# Patient Record
Sex: Male | Born: 1990 | Race: White | Hispanic: No | Marital: Married | Smoking: Never smoker
Health system: Southern US, Community
[De-identification: ages and names within clinical notes are randomized; demographics above are authoritative.]

## PROBLEM LIST (undated history)

## (undated) HISTORY — PX: WISDOM TOOTH EXTRACTION: SHX21

---
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
## 2012-03-27 ENCOUNTER — Encounter: Payer: Self-pay | Admitting: Family Medicine

## 2012-03-27 ENCOUNTER — Ambulatory Visit (INDEPENDENT_AMBULATORY_CARE_PROVIDER_SITE_OTHER): Payer: Managed Care, Other (non HMO) | Admitting: Family Medicine

## 2012-03-27 VITALS — BP 110/72 | Temp 98.0°F | Wt 172.0 lb

## 2012-03-27 DIAGNOSIS — J45909 Unspecified asthma, uncomplicated: Secondary | ICD-10-CM

## 2012-03-27 DIAGNOSIS — J4540 Moderate persistent asthma, uncomplicated: Secondary | ICD-10-CM

## 2012-03-27 MED ORDER — ALBUTEROL SULFATE HFA 108 (90 BASE) MCG/ACT IN AERS: 2.0000 | INHALATION_SPRAY | RESPIRATORY_TRACT | Status: DC | PRN

## 2012-03-27 MED ORDER — PREDNISONE 10 MG PO TABS: ORAL_TABLET | ORAL | Status: DC

## 2012-03-27 MED ORDER — BECLOMETHASONE DIPROPIONATE 80 MCG/ACT IN AERS: 2.0000 | INHALATION_SPRAY | Freq: Two times a day (BID) | RESPIRATORY_TRACT | Status: DC

## 2012-03-27 NOTE — Progress Notes (Signed)
  Subjective:    Patient ID: Lee Martin, male    DOB: 05-05-1991, 21 y.o.   MRN: [id number]  HPI  Patient has asthma. Possibly worsened over the spring with allergies. He is now having daily wheezing and frequent use of Proventil. His inhaler is over 36-year-old. He is having nighttime coughing and is starting to impair activities with dyspnea with exertion. He is not any recent infectious symptoms such as fever or chills.  He does not recall being on anti-inflammatory medications in the past. He has a history of frequent wheezing in the spring time. Nonsmoker.  Past Medical History  Diagnosis Date  . Asthma    Past Surgical History  Procedure Date  . Wisdom tooth extraction     reports that he has never smoked. He does not have any smokeless tobacco history on file. His alcohol and drug histories not on file. family history includes Diabetes in his father and maternal grandfather; Hyperlipidemia in his father; and Hypertension in his father. No Known Allergies    Review of Systems  Constitutional: Negative for fever and chills.  HENT: Positive for congestion. Negative for sore throat.   Respiratory: Positive for cough, shortness of breath and wheezing.   Cardiovascular: Negative for chest pain.  Skin: Negative for rash.  Neurological: Negative for dizziness and headaches.  Hematological: Negative for adenopathy.       Objective:   Physical Exam  Constitutional: He appears well-developed and well-nourished.  HENT:  Right Ear: External ear normal.  Left Ear: External ear normal.  Mouth/Throat: Oropharynx is clear and moist.  Neck: Neck supple. No thyromegaly present.  Cardiovascular: Normal rate and regular rhythm.   Pulmonary/Chest:       Patient has diffuse expiratory wheezes. No rales.  Lymphadenopathy:    He has no cervical adenopathy.          Assessment & Plan:  Asthma with at least moderate persistent severity-worsening symptoms over several weeks.. Oral  prednisone over the next 8 days. Refill Proventil. Start Qvar 80 2 puffs twice daily. Touch base in 2 weeks if symptoms not greatly improved

## 2012-03-27 NOTE — Patient Instructions (Signed)
Asthma, Adult Asthma is caused by narrowing of the air passages in the lungs. It may be triggered by pollen, dust, animal dander, molds, some foods, respiratory infections, exposure to smoke, exercise, emotional stress or other allergens (things that cause allergic reactions or allergies). Repeat attacks are common. HOME CARE INSTRUCTIONS   Use prescription medications as ordered by your caregiver.   Avoid pollen, dust, animal dander, molds, smoke and other things that cause attacks at home and at work.   You may have fewer attacks if you decrease dust in your home. Electrostatic air cleaners may help.   It may help to replace your pillows or mattress with materials less likely to cause allergies.   Talk to your caregiver about an action plan for managing asthma attacks at home, including, the use of a peak flow meter which measures the severity of your asthma attack. An action plan can help minimize or stop the attack without having to seek medical care.   If you are not on a fluid restriction, drink 8 to 10 glasses of water each day.   Always have a plan prepared for seeking medical attention, including, calling your physician, accessing local emergency care, and calling 911 (in the [place] for a severe attack.   Discuss possible exercise routines with your caregiver.   If animal dander is the cause of asthma, you may need to get rid of pets.  SEEK MEDICAL CARE IF:   You have wheezing and shortness of breath even if taking medicine to prevent attacks.   You have muscle aches, chest pain or thickening of sputum.   Your sputum changes from clear or white to yellow, green, gray, or bloody.   You have any problems that may be related to the medicine you are taking (such as a rash, itching, swelling or trouble breathing).  SEEK IMMEDIATE MEDICAL CARE IF:   Your usual medicines do not stop your wheezing or there is increased coughing and/or shortness of breath.   You have increased  difficulty breathing.   You have a fever.  MAKE SURE YOU:   Understand these instructions.   Will watch your condition.   Will get help right away if you are not doing well or get worse.  Document Released: 11/01/2005 Document Revised: 10/21/2011 Document Reviewed: 06/19/2008 [facility] Patient Information 2012 [place], [place].  Qvar 80 mcg 2 puffs every 12 hours and rinse mouth after use

## 2013-04-11 ENCOUNTER — Telehealth: Payer: Self-pay | Admitting: Family Medicine

## 2013-04-11 NOTE — Telephone Encounter (Signed)
PT called to request a refill of his albuterol (PROVENTIL HFA;VENTOLIN HFA) 108 (90 BASE) MCG/ACT inhaler. I explained to him that it has been over a year since we've seen him in the office, but he insisted that this was only for asthma and he should be able to receive a refill. Please assist.

## 2013-04-11 NOTE — Telephone Encounter (Signed)
I did inform pt of need for OV annually to fill prescription medicines.  He did say he still had some left, and has plenty of the QVar left.  Pt will call for OV soon.

## 2013-05-01 ENCOUNTER — Encounter: Payer: Self-pay | Admitting: Family Medicine

## 2013-05-01 ENCOUNTER — Ambulatory Visit (INDEPENDENT_AMBULATORY_CARE_PROVIDER_SITE_OTHER): Payer: Managed Care, Other (non HMO) | Admitting: Family Medicine

## 2013-05-01 VITALS — BP 100/70 | Temp 97.5°F | Wt 175.0 lb

## 2013-05-01 DIAGNOSIS — J45909 Unspecified asthma, uncomplicated: Secondary | ICD-10-CM

## 2013-05-01 DIAGNOSIS — J4520 Mild intermittent asthma, uncomplicated: Secondary | ICD-10-CM | POA: Insufficient documentation

## 2013-05-01 MED ORDER — ALBUTEROL SULFATE HFA 108 (90 BASE) MCG/ACT IN AERS: 2.0000 | INHALATION_SPRAY | RESPIRATORY_TRACT | Status: DC | PRN

## 2013-05-01 NOTE — Patient Instructions (Addendum)
Asthma, Adult Asthma is a condition that affects your lungs. It is characterized by swelling and narrowing of your airways as well as increased mucus production. The narrowing comes from swelling and muscle spasms inside the airways. When this happens, breathing can be difficult and you can have coughing, wheezing, and shortness of breath. Knowing more about asthma can help you manage it better. Asthma cannot be cured, but medicines and lifestyle changes can help control it. Asthma can be a minor problem for some people but if it is not controlled it can lead to a life-threatening asthma attack. Asthma can change over time. It is important to work with your caregiver to manage your asthma symptoms. CAUSES The exact cause of asthma is unknown. Asthma is believed to be caused by inherited (genetic) and environmental exposures. Swelling and redness (inflammation) of the airways occurs in asthma. This can be triggered by allergies, viral lung infections, or irritants in the air. Allergic reactions can cause you to wheeze immediately or several hours after an exposure. Asthma triggers are different for each person. It is important to pay attention and know what triggers your asthma.  Common triggers for asthma attacks include:  Animal dander from the skin, hair, or feathers of animals.  Dust mites contained in house dust.  Cockroaches.  Pollen from trees or grass.  Mold.  Cigarette or tobacco smoke. Smoking cannot be allowed in homes of people with asthma. People with asthma should not smoke and should not be around smokers.  Air pollutants such as dust, household cleaners, hair sprays, aerosol sprays, paint fumes, strong chemicals, or strong odors.  Cold air or weather changes. Cold air may cause inflammation. Winds increase molds and pollens in the air. There is not one best climate for people with asthma.  Strong emotions such as crying or laughing hard.  Stress.  Certain medicines such as  aspirin or beta-blockers.  Sulfites in such foods and drinks as dried fruits and wine.  Infections or inflammatory conditions such as the flu, a cold, or an inflammation of the nasal membranes (rhinitis).  Gastroesophageal reflux disease (GERD). GERD is a condition where stomach acid backs up into your throat (esophagus).  Exercise or strenous activity. Proper pre-exercise medicines allow most people to participate in sports. SYMPTOMS  Feeling short of breath.  Chest tightness or pain.  Difficulty sleeping due to coughing, wheezing, or feeling short of breath.  A whistling or wheezing sound with exhalation.  Coughing or wheezing that is worse when you:  Have a virus (such as a cold or the flu).  Are suffering from allergies.  Are exposed to certain fumes or chemicals.  Exercise. Signs that your asthma is probably getting worse include:   More frequent and bothersome asthma signs and symptoms.  Increasing difficulty breathing. This can be measured by a peak flow meter, which is a simple device used to check how well your lungs are working.  An increasingly frequent need to use a quick-relief inhaler. DIAGNOSIS  The diagnosis of asthma is made by review of your medical history, a physical exam, and possibly from other tests. Lung function studies may help with the diagnosis. TREATMENT  Asthma cannot be cured. However, for the majority of adults, asthma can be controlled with treatment. Besides avoidance of triggers of your asthma, medicines are often required. There are 2 classes of medicine used for asthma treatment: controller medicines (reduce inflammation and symptoms) andreliever or rescue medicines (relieve asthma symptoms during acute attacks). You may require daily   medicines to control your asthma. The most effective long-term controller medicines for asthma are inhaled corticosteroids (blocks inflammation). Other long-term control medicines include:  Leukotriene  receptor antagonists (blocks a pathway of inflammation).  Long-acting beta2-agonists (relaxes the muscles of the airways for at least 12 hours) with an inhaled corticosteroid.  Cromolyn sodium or nedocromil (alters certain inflammatory cells' ability to release chemicals that cause inflammation).  Immunomodulators (alters the immune system to prevent asthma symptoms).  Theophylline (relaxes muscles in the airways). You may also require a short-acting beta2-agonist to relieve asthma symptoms during an acute attack. You should understand what to do during an acute attack. Inhaled medicines are effective when used properly. Read the instructions on how to use your medicines correctly and speak to your caregiver if you have questions. Follow up with your caregiver on a regular basis to make sure your asthma is well-controlled. If your asthma is not well-controlled, if you have been hospitalized for asthma, or if multiple medicines or medium to high doses of inhaled corticosteroids are needed to control your asthma, request a referral to an asthma specialist. HOME CARE INSTRUCTIONS   Take medicines as directed by your caregiver.  Control your home environment in the following ways to help prevent asthma attacks:  Change your heating and air conditioning filter at least once a month.  Place a filter or cheesecloth over your heating and air conditioning vents.  Limit the use of fireplaces and wood stoves.  Do not smoke. Do not stay in places where others are smoking.  Get rid of pests (such as roaches and mice) and their droppings.  If you see mold on a plant, throw it away.  Clean your floors and dust every week. Use unscented cleaning products. Use a vacuum cleaner with a HEPA filter if possible. If vacuuming or cleaning triggers your asthma, try to find someone else to do these chores.  Floors in your house should be wood, tile, or vinyl. Carpet can trap dander and dust.  Use  allergy-proof pillows, mattress covers, and box spring covers.  Wash bedsheets and blankets every week in hot water and dry in a dryer.  Use a blanket that is made of polyester or cotton with a tight nap.  Do not use a dust ruffle on your bed.  Clean bathrooms and kitchens with bleach and repaint with mold-resistant paint.  Wash hands frequently.  Talk to your caregiver about an action plan for managing asthma attacks. This includes the use of a peak flow meter which measures the severity of the attack and medicines that can help stop the attack. An action plan can help minimize or stop the attack without having to seek medical care.  Remain calm during an asthma attack.  Always have a plan prepared for seeking medical attention. This should include contacting your caregiver and in the case of a severe attack, calling your local emergency services (911 in [place] SEEK MEDICAL CARE IF:   You have wheezing, shortness of breath, or a cough even if taking medicine to prevent attacks.  You have thickening of sputum.  Your sputum changes from clear or white to yellow, green, gray, or bloody.  You have any problems that may be related to the medicines you are taking (such as a rash, itching, swelling, or trouble breathing).  You are using a reliever medicine more than 2 3 times per week.  Your peak flow is still at 50 79% of personal best after following your action plan for 1   hour. SEEK IMMEDIATE MEDICAL CARE IF:   You are short of breath even at rest.  You get short of breath when doing very little physical activity.  You have difficulty eating, drinking, or talking due to asthma symptoms.  You have chest pain or you feel that your heart is beating fast.  You have a bluish color to your lips or fingernails.  You are lightheaded, dizzy, or faint.  You have a fever or persistent symptoms for more than 2 3 days.  You have a fever and symptoms suddenly get worse.  You seem to be  getting worse and are unresponsive to treatment during an asthma attack.  Your peak flow is less than 50% of personal best. MAKE SURE YOU:   Understand these instructions.  Will watch your condition.  Will get help right away if you are not doing well or get worse. Document Released: 11/01/2005 Document Revised: 10/18/2012 Document Reviewed: 06/19/2008 [employer] Patient Information 2014 [employer].  

## 2013-05-01 NOTE — Progress Notes (Signed)
  Subjective:    Patient ID: Lee Martin, male    DOB: 05-26-1991, 22 y.o.   MRN: [id number]  HPI Patient here for followup asthma. This is probably more mild intermittent asthma. He states he has used less than 1 albuterol inhaler for the entire year. 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 Asthma tends to be more seasonal. He uses Qvar intermittently as needed during season. Had some wheezing last week but none at this time. Requesting refills.  Nonsmoker. Otherwise very healthy.  Past Medical History  Diagnosis Date  . Asthma    Past Surgical History  Procedure Laterality Date  . Wisdom tooth extraction      reports that he has never smoked. He does not have any smokeless tobacco history on file. His alcohol and drug histories are not on file. family history includes Diabetes in his father and maternal grandfather; Hyperlipidemia in his father; and Hypertension in his father. No Known Allergies    Review of Systems  Constitutional: Negative for fever and chills.  HENT: Negative for congestion.   Respiratory: Negative for cough and shortness of breath.        Objective:   Physical Exam  Constitutional: He appears well-developed and well-nourished.  HENT:  Right Ear: External ear normal.  Left Ear: External ear normal.  Mouth/Throat: Oropharynx is clear and moist.  Neck: Neck supple. No thyromegaly present.  Cardiovascular: Normal rate and regular rhythm.   No murmur heard. Pulmonary/Chest: Effort normal and breath sounds normal. No respiratory distress. He has no wheezes. He has no rales.  Lymphadenopathy:    He has no cervical adenopathy.          Assessment & Plan:  Asthma. This appears to be probably mild intermittent. Refill ProAir inhaler for as needed use. He knows to start back Qvar regularly for more persistent symptoms. Continue yearly flu vaccine.

## 2015-01-20 ENCOUNTER — Other Ambulatory Visit: Payer: Self-pay | Admitting: Family Medicine

## 2015-01-24 ENCOUNTER — Telehealth: Payer: Self-pay | Admitting: Family Medicine

## 2015-01-24 MED ORDER — ALBUTEROL SULFATE HFA 108 (90 BASE) MCG/ACT IN AERS: 2.0000 | INHALATION_SPRAY | RESPIRATORY_TRACT | Status: DC | PRN

## 2015-01-24 NOTE — Telephone Encounter (Signed)
Rx sent to pharmacy and pt is aware. 

## 2015-01-24 NOTE — Telephone Encounter (Signed)
Needs Proair refilled but cannot sch a visit until school is out in June. Will have to check his work schedule as well. Needs his inhaler refilled through June. Please call pt and leave a message once refilled.

## 2016-03-16 ENCOUNTER — Ambulatory Visit (INDEPENDENT_AMBULATORY_CARE_PROVIDER_SITE_OTHER): Payer: Managed Care, Other (non HMO) | Admitting: Family Medicine

## 2016-03-16 VITALS — BP 120/88 | HR 75 | Temp 98.3°F | Ht 68.5 in | Wt 199.3 lb

## 2016-03-16 DIAGNOSIS — J4520 Mild intermittent asthma, uncomplicated: Secondary | ICD-10-CM

## 2016-03-16 MED ORDER — BECLOMETHASONE DIPROPIONATE 80 MCG/ACT IN AERS: 2.0000 | INHALATION_SPRAY | Freq: Two times a day (BID) | RESPIRATORY_TRACT | Status: DC

## 2016-03-16 MED ORDER — ALBUTEROL SULFATE HFA 108 (90 BASE) MCG/ACT IN AERS: 2.0000 | INHALATION_SPRAY | RESPIRATORY_TRACT | Status: DC | PRN

## 2016-03-16 NOTE — Progress Notes (Signed)
   Subjective:    Patient ID: Lee Martin, male    DOB: 05/04/1991, 25 y.o.   MRN: [id number]  HPI Patient seen for follow-up asthma. Predominantly mild intermittent but she's had more wheezing recently during the spring with allergies he takes pro-air as needed. Has used Qvar in the past but not for several months now. Frequently has been using his albuterol inhaler at least a few times per week. No recent fevers or chills. Nonsmoker. Exercises regularly. Takes no other medications.  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  Past Medical History  Diagnosis Date  . Asthma    Past Surgical History  Procedure Laterality Date  . Wisdom tooth extraction      reports that he has never smoked. He does not have any smokeless tobacco history on file. His alcohol and drug histories are not on file. family history includes Diabetes in his father and maternal grandfather; Hyperlipidemia in his father; Hypertension in his father. No Known Allergies    Review of Systems  Constitutional: Negative for fever and chills.  Respiratory: Negative for cough and shortness of breath.        Objective:   Physical Exam  Constitutional: He appears well-developed and well-nourished.  HENT:  Right Ear: External ear normal.  Left Ear: External ear normal.  Mouth/Throat: Oropharynx is clear and moist.  Neck: Neck supple.  Cardiovascular: Normal rate and regular rhythm.   Pulmonary/Chest: Effort normal and breath sounds normal. No respiratory distress. He has no wheezes. He has no rales.  Lymphadenopathy:    He has no cervical adenopathy.          Assessment & Plan:  Asthma. This has been labeled as mild intermittent (and probably has been in the past) but recently has had more frequent wheezing. We recommend starting back Qvar 2 puffs twice daily and continue as needed albuterol with refills given.  He appears to have  significant seasonal variation related to allergies.  Kristian CoveyBruce W Edilberto Roosevelt MD [facility]

## 2016-03-16 NOTE — Progress Notes (Signed)
Pre visit review using our clinic review tool, if applicable. No additional management support is needed unless otherwise documented below in the visit note. 

## 2017-04-05 ENCOUNTER — Encounter: Payer: Self-pay | Admitting: Family Medicine

## 2017-04-05 ENCOUNTER — Ambulatory Visit (INDEPENDENT_AMBULATORY_CARE_PROVIDER_SITE_OTHER): Payer: Managed Care, Other (non HMO) | Admitting: Family Medicine

## 2017-04-05 VITALS — BP 104/70 | HR 56 | Temp 98.3°F | Ht 69.25 in | Wt 200.5 lb

## 2017-04-05 DIAGNOSIS — Z0000 Encounter for general adult medical examination without abnormal findings: Secondary | ICD-10-CM

## 2017-04-05 MED ORDER — ALBUTEROL SULFATE HFA 108 (90 BASE) MCG/ACT IN AERS: 2.0000 | INHALATION_SPRAY | RESPIRATORY_TRACT | 3 refills | Status: DC | PRN

## 2017-04-05 NOTE — Progress Notes (Signed)
Subjective:     Patient ID: Lee Martin, male   DOB: 09/13/1991, 26 y.o.   MRN: [id number]  HPI   Patient for physical exam. He graduated from college last year and works currently as an Art gallery managerengineer. Staying very busy recently. No very consistent exercise recently. He is discouraged in that he has had some recent difficulty losing weight. Generally seems to have very healthy diet. Not tracking calories.  He states he had blood work through his employer and no significant abnormalities. He has intermittent asthma and requesting refill of albuterol. He's had some problems with allergies this spring. His tetanus is up-to-date.  Past Medical History:  Diagnosis Date  . Asthma    Past Surgical History:  Procedure Laterality Date  . WISDOM TOOTH EXTRACTION      reports that he has never smoked. He has never used smokeless tobacco. His alcohol and drug histories are not on file. family history includes Diabetes in his father and maternal grandfather; Hyperlipidemia in his father; Hypertension in his father. No Known Allergies   Review of Systems  Constitutional: Negative for activity change, appetite change, fatigue and fever.  HENT: Negative for congestion, ear pain and trouble swallowing.   Eyes: Negative for pain and visual disturbance.  Respiratory: Negative for cough, shortness of breath and wheezing.   Cardiovascular: Negative for chest pain and palpitations.  Gastrointestinal: Negative for abdominal distention, abdominal pain, blood in stool, constipation, diarrhea, nausea, rectal pain and vomiting.  Genitourinary: Negative for dysuria, hematuria and testicular pain.  Musculoskeletal: Negative for arthralgias and joint swelling.  Skin: Negative for rash.  Neurological: Negative for dizziness, syncope and headaches.  Hematological: Negative for adenopathy.  Psychiatric/Behavioral: Negative for confusion and dysphoric mood.       Objective:   Physical Exam  Constitutional: He is  oriented to person, place, and time. He appears well-developed and well-nourished. No distress.  HENT:  Head: Normocephalic and atraumatic.  Right Ear: External ear normal.  Left Ear: External ear normal.  Mouth/Throat: Oropharynx is clear and moist.  Eyes: Conjunctivae and EOM are normal. Pupils are equal, round, and reactive to light.  Neck: Normal range of motion. Neck supple. No thyromegaly present.  Cardiovascular: Normal rate, regular rhythm and normal heart sounds.   No murmur heard. Pulmonary/Chest: No respiratory distress. He has no wheezes. He has no rales.  Abdominal: Soft. Bowel sounds are normal. He exhibits no distension and no mass. There is no tenderness. There is no rebound and no guarding.  Musculoskeletal: He exhibits no edema.  Lymphadenopathy:    He has no cervical adenopathy.  Neurological: He is alert and oriented to person, place, and time. He displays normal reflexes. No cranial nerve deficit.  Skin: No rash noted.  Psychiatric: He has a normal mood and affect.       Assessment:     Physical exam. Generally healthy 26 year old male    Plan:     -We discussed healthy strategies for weight loss. He will consider calorie tracking application and reduce sugars and starches -Increased frequency of exercise and do combination of aerobic and resistance training -Asked that he try to forward copy of recent labs through his work to us  Kristian CoveyBruce W Maricsa Sammons MD [facility]

## 2017-12-02 ENCOUNTER — Ambulatory Visit: Payer: BLUE CROSS/BLUE SHIELD | Admitting: Family Medicine

## 2017-12-02 ENCOUNTER — Encounter: Payer: Self-pay | Admitting: Family Medicine

## 2017-12-02 VITALS — BP 102/64 | HR 86 | Temp 98.7°F | Wt 205.7 lb

## 2017-12-02 DIAGNOSIS — M5416 Radiculopathy, lumbar region: Secondary | ICD-10-CM

## 2017-12-02 MED ORDER — PREDNISONE 10 MG PO TABS: ORAL_TABLET | ORAL | 0 refills | Status: DC

## 2017-12-02 NOTE — Progress Notes (Signed)
Subjective:     Patient ID: Lee Martin, male   DOB: 09/14/1991, 27 y.o.   MRN: [id number]  HPI Patient seen with some progressive pain is rated from his right lumbar area down to the buttock and always to the foot. He states that symptoms started about 3 weeks ago. On New Year's Eve he ran about 5 miles and then went to a concert at night and states he was dancing for about 6 hours. There is no injury. No alcohol use. By January 3 he noted some sharp pains rating from the lower back down to the right foot that were sharp and very intense at times. He also has some numbness in the calf region and also lateral foot. Took some Aleve without much relief.  He went to urgent care around January 5 was placed on prednisone which initially seemed to help slightly. He has some persistent pain even with the prednisone. He requested pain medications was but was declined. Since that time he's had some progressive symptoms and has numbness in the calf and also bottom of the foot and lateral foot region. He is noticing some increased limp and weakness in the foot. No urine or stool incontinence. No prior history of back difficulties. Pain is really interfering with sleep significantly this time.  Past Medical History:  Diagnosis Date  . Asthma    Past Surgical History:  Procedure Laterality Date  . WISDOM TOOTH EXTRACTION      reports that  has never smoked. he has never used smokeless tobacco. His alcohol and drug histories are not on file. family history includes Diabetes in his father and maternal grandfather; Hyperlipidemia in his father; Hypertension in his father. No Known Allergies   Review of Systems  Constitutional: Negative for appetite change, fever and unexpected weight change.  Respiratory: Negative for shortness of breath.   Cardiovascular: Negative for chest pain.  Gastrointestinal: Negative for abdominal pain.  Genitourinary: Negative for dysuria.  Musculoskeletal: Positive for back  pain.  Skin: Negative for rash.  Neurological: Positive for weakness. Negative for dizziness.  Hematological: Negative for adenopathy.       Objective:   Physical Exam  Constitutional: He appears well-developed and well-nourished.  Cardiovascular: Normal rate and regular rhythm.  Pulmonary/Chest: Effort normal and breath sounds normal. No respiratory distress. He has no wheezes. He has no rales.  Musculoskeletal:  Straight leg raise are positive on the right. No leg edema. No localized tenderness.  Neurological:  Patient has weakness with dorsiflexion on the right compared to the left. He has fairly preserved strength with plantar flexion bilaterally. He has 2+ reflexes knee and 2+ left ankle. He has absence of reflux right ankle. He has impaired sentry to touch involving the posterior calf and right lateral foot region       Assessment:     Progressive right-sided sciatica symptoms. He has neurologic findings including numbness, weakness, and absence of right ankle reflex.    Plan:     -Prescribed 1 more go round of prednisone -Set up MRI lumbar spine. We explained to patient we do not normally get MRI this soon of onset of symptoms but he has definite weakness with dorsiflexion on the right with absence of ankle reflex and progressive numbness and pain -Touch base for any progressive pain or any new symptoms  Kristian CoveyBruce W Larnce Schnackenberg MD [facility]

## 2017-12-02 NOTE — Patient Instructions (Signed)
Sciatica Sciatica is pain, numbness, weakness, or tingling along the path of the sciatic nerve. The sciatic nerve starts in the lower back and runs down the back of each leg. The nerve controls the muscles in the lower leg and in the back of the knee. It also provides feeling (sensation) to the back of the thigh, the lower leg, and the sole of the foot. Sciatica is a symptom of another medical condition that pinches or puts pressure on the sciatic nerve. Generally, sciatica only affects one side of the body. Sciatica usually goes away on its own or with treatment. In some cases, sciatica may keep coming back (recur). What are the causes? This condition is caused by pressure on the sciatic nerve, or pinching of the sciatic nerve. This may be the result of:  A disk in between the bones of the spine (vertebrae) bulging out too far (herniated disk).  Age-related changes in the spinal disks (degenerative disk disease).  A pain disorder that affects a muscle in the buttock (piriformis syndrome).  Extra bone growth (bone spur) near the sciatic nerve.  An injury or break (fracture) of the pelvis.  Pregnancy.  Tumor (rare).  What increases the risk? The following factors may make you more likely to develop this condition:  Playing sports that place pressure or stress on the spine, such as football or weight lifting.  Having poor strength and flexibility.  A history of back injury.  A history of back surgery.  Sitting for long periods of time.  Doing activities that involve repetitive bending or lifting.  Obesity.  What are the signs or symptoms? Symptoms can vary from mild to very severe, and they may include:  Any of these problems in the lower back, leg, hip, or buttock: ? Mild tingling or dull aches. ? Burning sensations. ? Sharp pains.  Numbness in the back of the calf or the sole of the foot.  Leg weakness.  Severe back pain that makes movement difficult.  These  symptoms may get worse when you cough, sneeze, or laugh, or when you sit or stand for long periods of time. Being overweight may also make symptoms worse. In some cases, symptoms may recur over time. How is this diagnosed? This condition may be diagnosed based on:  Your symptoms.  A physical exam. Your health care provider may ask you to do certain movements to check whether those movements trigger your symptoms.  You may have tests, including: ? Blood tests. ? X-rays. ? MRI. ? CT scan.  How is this treated? In many cases, this condition improves on its own, without any treatment. However, treatment may include:  Reducing or modifying physical activity during periods of pain.  Exercising and stretching to strengthen your abdomen and improve the flexibility of your spine.  Icing and applying heat to the affected area.  Medicines that help: ? To relieve pain and swelling. ? To relax your muscles.  Injections of medicines that help to relieve pain, irritation, and inflammation around the sciatic nerve (steroids).  Surgery.  Follow these instructions at home: Medicines  Take over-the-counter and prescription medicines only as told by your health care provider.  Do not drive or operate heavy machinery while taking prescription pain medicine. Managing pain  If directed, apply ice to the affected area. ? Put ice in a plastic bag. ? Place a towel between your skin and the bag. ? Leave the ice on for 20 minutes, 2-3 times a day.  After icing, apply   heat to the affected area before you exercise or as often as told by your health care provider. Use the heat source that your health care provider recommends, such as a moist heat pack or a heating pad. ? Place a towel between your skin and the heat source. ? Leave the heat on for 20-30 minutes. ? Remove the heat if your skin turns bright red. This is especially important if you are unable to feel pain, heat, or cold. You may have a  greater risk of getting burned. Activity  Return to your normal activities as told by your health care provider. Ask your health care provider what activities are safe for you. ? Avoid activities that make your symptoms worse.  Take brief periods of rest throughout the day. Resting in a lying or standing position is usually better than sitting to rest. ? When you rest for longer periods, mix in some mild activity or stretching between periods of rest. This will help to prevent stiffness and pain. ? Avoid sitting for long periods of time without moving. Get up and move around at least one time each hour.  Exercise and stretch regularly, as told by your health care provider.  Do not lift anything that is heavier than 10 lb (4.5 kg) while you have symptoms of sciatica. When you do not have symptoms, you should still avoid heavy lifting, especially repetitive heavy lifting.  When you lift objects, always use proper lifting technique, which includes: ? Bending your knees. ? Keeping the load close to your body. ? Avoiding twisting. General instructions  Use good posture. ? Avoid leaning forward while sitting. ? Avoid hunching over while standing.  Maintain a healthy weight. Excess weight puts extra stress on your back and makes it difficult to maintain good posture.  Wear supportive, comfortable shoes. Avoid wearing high heels.  Avoid sleeping on a mattress that is too soft or too hard. A mattress that is firm enough to support your back when you sleep may help to reduce your pain.  Keep all follow-up visits as told by your health care provider. This is important. Contact a health care provider if:  You have pain that wakes you up when you are sleeping.  You have pain that gets worse when you lie down.  Your pain is worse than you have experienced in the past.  Your pain lasts longer than 4 weeks.  You experience unexplained weight loss. Get help right away if:  You lose control  of your bowel or bladder (incontinence).  You have: ? Weakness in your lower back, pelvis, buttocks, or legs that gets worse. ? Redness or swelling of your back. ? A burning sensation when you urinate. This information is not intended to replace advice given to you by your health care provider. Make sure you discuss any questions you have with your health care provider. Document Released: 10/26/2001 Document Revised: 04/06/2016 Document Reviewed: 07/11/2015 Elsevier Interactive Patient Education  2018 [employer]  

## 2017-12-11 ENCOUNTER — Ambulatory Visit
Admission: RE | Admit: 2017-12-11 | Discharge: 2017-12-11 | Disposition: A | Discharge status: Home / Self Care | Payer: BLUE CROSS/BLUE SHIELD | Source: Ambulatory Visit | Attending: Family Medicine | Admitting: Family Medicine

## 2017-12-11 DIAGNOSIS — M5416 Radiculopathy, lumbar region: Secondary | ICD-10-CM

## 2017-12-12 ENCOUNTER — Other Ambulatory Visit: Payer: Self-pay | Admitting: Family Medicine

## 2017-12-12 DIAGNOSIS — M545 Low back pain, unspecified: Secondary | ICD-10-CM

## 2017-12-13 ENCOUNTER — Telehealth: Payer: Self-pay | Admitting: Family Medicine

## 2017-12-13 DIAGNOSIS — M545 Low back pain: Secondary | ICD-10-CM

## 2017-12-13 NOTE — Telephone Encounter (Signed)
Spoke with pt.  My only concern was that he had diminished ankle reflex and weakness with dorsiflexion.  His symptoms are stable.  Still has some numbness but pain some improved and he thinks strength some improved.  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    Let's see if we can get in to see Lee Martin as soon as possible.  May still need to see neurosurgical specialist if symptoms persist.

## 2017-12-13 NOTE — Telephone Encounter (Signed)
Lee OsierMichael Martin called and would like to see another provider before seeing Dr. Estill BambergMark Martin. He wants to [facility] Antoine PrimasZachary Smith, [facility] before he speaks to a surgeon.

## 2017-12-14 ENCOUNTER — Telehealth: Payer: Self-pay | Admitting: Family Medicine

## 2017-12-14 NOTE — Addendum Note (Signed)
Addended by: Kern ReapVEREEN, Jeriel Vivanco B on: 12/14/2017 09:21 AM   Modules accepted: Orders

## 2017-12-14 NOTE — Telephone Encounter (Signed)
Patient is requesting second opinion on back surgery before being referred to a surgeon by Dr. Caryl NeverBurchette.  Can patient be worked in before next available new slot?

## 2017-12-14 NOTE — Telephone Encounter (Signed)
Copied from CRM [phone]. Topic: Appointment Scheduling - Scheduling Inquiry for Clinic >> Dec 14, 2017 11:38 AM Stephannie LiSimmons, Criston Chancellor L, NT wrote: Reason for CRM: [facility]  like this patient scheduled as soon as possible by Dr Katrinka BlazingSmith ,please advise

## 2017-12-14 NOTE — Telephone Encounter (Signed)
Referral placed.

## 2017-12-14 NOTE — Telephone Encounter (Signed)
Copied from CRM [phone]. Topic: General - Other >> Dec 14, 2017 11:42 AM Stephannie LiSimmons, Zakery Normington L, NT wrote: Reason for CRM: Dr Katrinka BlazingSmith does not have any availability until 12/26/17  at  2:15 pm [facility] .

## 2017-12-15 NOTE — Telephone Encounter (Signed)
Pt scheduled for 2.4.19 with dr Katrinka Blazingsmith.

## 2017-12-16 ENCOUNTER — Encounter: Payer: Self-pay | Admitting: Family Medicine

## 2017-12-16 ENCOUNTER — Ambulatory Visit: Payer: BLUE CROSS/BLUE SHIELD | Admitting: Family Medicine

## 2017-12-16 VITALS — BP 112/80 | HR 74 | Ht 70.0 in | Wt 204.0 lb

## 2017-12-16 DIAGNOSIS — M5416 Radiculopathy, lumbar region: Secondary | ICD-10-CM | POA: Diagnosis not present

## 2017-12-16 DIAGNOSIS — M549 Dorsalgia, unspecified: Secondary | ICD-10-CM | POA: Diagnosis not present

## 2017-12-16 MED ORDER — MELOXICAM 15 MG PO TABS: 15.0000 mg | ORAL_TABLET | Freq: Every day | ORAL | 0 refills | Status: DC

## 2017-12-16 MED ORDER — GABAPENTIN 100 MG PO CAPS: 200.0000 mg | ORAL_CAPSULE | Freq: Every day | ORAL | 3 refills | Status: DC

## 2017-12-16 NOTE — Assessment & Plan Note (Signed)
Patient continues to have a positive straight leg test.  With only some mild weakness of dorsiflexion.  States that this seems to be better than what it was last week.  Reading patient's notes it does seem that patient is doing relatively better.  Discussing with him at patient's age and had been responding a little bit to the anti-inflammatories I would like to continue with conservative therapy.  Started on gabapentin for nighttime relief as well as meloxicam.  Sent to formal physical therapy and given home exercises today.  Discussed icing progression patient will hold on any type of significant physical activity until strength is improved.  We discussed multiple different other treatment options in the long run including osteopathic manipulation as well as the possibility of epidural or nerve root injections.  Patient is in agreement with plan and will follow up with me again in 2 weeks

## 2017-12-16 NOTE — Patient Instructions (Addendum)
Good to see you  Ice is your friend Ice 20 minutes 2 times daily. Usually after activity and before bed. Exercises 3 times a week.  Gabapentin 200mg  at night Meloxicam daily for 10 days then as needed PT will be calling you  Vitamin D 2000 IU daily over the counter  See me again in 2 weeks to make sure doing well and will increase activity

## 2017-12-16 NOTE — Progress Notes (Signed)
[facility] [address] [place], [place] [address] Phone: [phone] Subjective:    I'm seeing this patient by the request  of:    CC: Back pain.  [id number]  Lee Martin is a 27 y.o. male coming in with complaint of right leg pain. States he runs long distances. He completed a 5 mile run and attended a concert after. A few days after the concert he felt the pain. Started as back pain.  Onset- NYE Location- Hamstring Duration- All day Character- Tight, sore, sharp Aggravating factors- Extension Reliving factors- Ice, heat Therapies tried- Advil Severity-9 out of 10 initially but now close to 5 out of 10.  Seems to be improving somewhat.  Patient patient was initially seen in urgent care and then seen by primary care provider.  Patient did have an MRI done.  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     Past Medical History:  Diagnosis Date  . Asthma    Past Surgical History:  Procedure Laterality Date  . WISDOM TOOTH EXTRACTION     Social History   Socioeconomic History  . Marital status: Married    Spouse name: Not on file  . Number of children: Not on file  . Years of education: Not on file  . Highest education level: Not on file  Social Needs  . Financial resource strain: Not on file  . Food insecurity - worry: Not on file  . Food insecurity - inability: Not on file  . Transportation needs - medical: Not on file  . Transportation needs - non-medical: Not on file  Occupational History  . Not on file  Tobacco Use  . Smoking status: Never Smoker  . Smokeless tobacco: Never Used  Substance and Sexual Activity  . Alcohol use: Not on file  . Drug use: Not on file  . Sexual activity: Not on file  Other Topics Concern  . Not on file  Social History Narrative  . Not on file   No Known Allergies Family History  Problem Relation Age of Onset   . Diabetes Father   . Hypertension Father   . Hyperlipidemia Father   . Diabetes Maternal Grandfather      Past medical history, social, surgical and family history all reviewed in electronic medical record.  No pertanent information unless stated regarding to the chief complaint.   Review of Systems:Review of systems updated and as accurate as of 12/16/17  No headache, visual changes, nausea, vomiting, diarrhea, constipation, dizziness, abdominal pain, skin rash, fevers, chills, night sweats, weight loss, swollen lymph nodes, body aches, joint swelling, muscle aches, chest pain, shortness of breath, mood changes.   Objective  There were no vitals taken for this visit. Systems examined below as of 12/16/17   General: No apparent distress alert and oriented x3 mood and affect normal, dressed appropriately.  HEENT: Pupils equal, extraocular movements intact  Respiratory: Patient's speak in full sentences and does not appear short of breath  Cardiovascular: No lower extremity edema, non tender, no erythema  Skin: Warm dry intact with no signs of infection or rash on extremities or on axial skeleton.  Abdomen: Soft nontender  Neuro: Cranial nerves II through XII are intact, neurovascularly intact in all extremities with 2+ DTRs and 2+ pulses.  Lymph: No lymphadenopathy of posterior or anterior cervical chain or axillae bilaterally.  Gait normal with good balance and coordination.  MSK:  Non tender with full range of motion and good stability and symmetric strength and tone of shoulders, elbows, wrist, hip, knee and ankles bilaterally. Back Exam:  Inspection: Mild loss of lordosis Motion: Flexion 25 deg with worsening radicular symptoms down the right leg, Extension 20 deg, Side Bending to 30 deg bilaterally,  Rotation to 35 deg bilaterally  SLR laying: Positive on the right XSLR laying: Also with radicular symptoms on the right leg Palpable tenderness: Tender over the L5-S1 area on the  right side of the paraspinal musculature. FABER: Mild positive right. Sensory change: Gross sensation intact to all lumbar and sacral dermatomes.  Reflexes: 2+ at both patellar tendons, 2+ at achilles tendons, Babinski's downgoing.  Strength at foot  Plantar-flexion: 5/5 Dorsi-flexion: 4/5 on the right compared to the contralateral side eversion: 5/5 Inversion: 5/5  Leg strength  Quad: 5/5 Hamstring: 5/5 Hip flexor: 5/5 Hip abductors: 5/5  Gait unremarkable.   [id number]; 15 additional minutes spent for Therapeutic exercises as stated in above notes.  This included exercises focusing on stretching, strengthening, with significant focus on eccentric aspects.   Long term goals include an improvement in range of motion, strength, endurance as well as avoiding reinjury. Patient's frequency would include in 1-2 times a day, 3-5 times a week for a duration of 6-12 weeks.Low back exercises that included:  Pelvic tilt/bracing instruction to focus on control of the pelvic girdle and lower abdominal muscles  Glute strengthening exercises, focusing on proper firing of the glutes without engaging the low back muscles Proper stretching techniques for maximum relief for the hamstrings, hip flexors, low back and some rotation where tolerated    Proper technique shown and discussed handout in great detail with ATC.  All questions were discussed and answered.     Impression and Recommendations:     This case required medical decision making of moderate complexity.      Note: This dictation was prepared with Dragon dictation along with smaller phrase technology. Any transcriptional errors that result from this process are unintentional.

## 2017-12-19 ENCOUNTER — Ambulatory Visit: Payer: BLUE CROSS/BLUE SHIELD | Admitting: Family Medicine

## 2017-12-22 ENCOUNTER — Encounter: Payer: Self-pay | Admitting: Physical Therapy

## 2017-12-22 ENCOUNTER — Other Ambulatory Visit: Payer: Self-pay

## 2017-12-22 ENCOUNTER — Ambulatory Visit: Payer: BLUE CROSS/BLUE SHIELD | Attending: Family Medicine | Admitting: Physical Therapy

## 2017-12-22 DIAGNOSIS — R29898 Other symptoms and signs involving the musculoskeletal system: Secondary | ICD-10-CM | POA: Insufficient documentation

## 2017-12-22 DIAGNOSIS — M5441 Lumbago with sciatica, right side: Secondary | ICD-10-CM | POA: Insufficient documentation

## 2017-12-22 NOTE — Patient Instructions (Signed)
Press Up (Extension)   Place hands in a position for a half push-up. Press top half of body upward using arms. Let lower back sag. Hold __15__ seconds. Lower body. Repeat __5-10__ times. Do __2__ sessions per day.  Hamstring Stretch: Active   Support behind right knee. Starting with knee bent, attempt to straighten knee until a comfortable stretch is felt in back of thigh. Hold __5__ seconds. Repeat __10-15__ times per set. Do __2__ sets per session.   Hamstring Step 2   Left foot relaxed, knee straight, other leg bent, foot flat. Raise straight leg further upward to maximal range. Hold _30__ seconds. Relax leg completely down. Repeat _3__ times.  Piriformis (Supine)   Cross legs, right on top. Gently pull other knee toward chest until stretch is felt in buttock/hip of top leg. Hold __30__ seconds. Repeat __3__ times per set. Do __2__ sets per session.

## 2017-12-22 NOTE — Therapy (Signed)
[facility] Outpatient Rehabilitation [facility] [address]  [address] [place], [place], [address] Phone: [phone]   Fax:  [phone]  Physical Therapy Evaluation  Patient Details  Name: Lee Martin MRN: [id number] Date of Birth: [birth year] Referring Provider: Dr. Antoine Martin   Encounter Date: 12/22/2017  PT End of Session - 12/22/17 1759    Visit Number  1    Number of Visits  12    Date for PT Re-Evaluation  02/02/18    PT Start Time  1615    PT Stop Time  1650    PT Time Calculation (min)  35 min    Activity Tolerance  Patient tolerated treatment well    Behavior During Therapy  [facility] for tasks assessed/performed       Past Medical History:  Diagnosis Date  . Asthma     Past Surgical History:  Procedure Laterality Date  . WISDOM TOOTH EXTRACTION      There were no vitals filed for this visit.   Subjective Assessment - 12/22/17 1616    Subjective  Back + leg pain. Had steroids with mild relief. MRI - 2 discs protruding. Seen by Dr. Katrinka Martin - 1 disc probably old injury, 1 - new injury. History of lower leg cramps. No true mechanism of injury. Does train for long distance running. Has not been running. Has been limping - starting to resolve. Going to see neurosurgeon tomorrow.    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 A second component of the disc herniation extends into the left subarticular zone and contacts the descending left S1 nerve root in the lateral recess.    Patient Stated Goals  return to running and exercise    Currently in Pain?  No/denies    Pain Location  Leg    Pain Orientation  Right    Pain Descriptors / Indicators  Numbness;Shooting;Stabbing    Pain Type  Acute pain    Pain Onset  More than a month ago    Pain Frequency  Intermittent    Aggravating Factors   bending  over to sink, stretches    Pain Relieving Factors  3 Advil         [facility] PT Assessment - 12/22/17 1621      Assessment   Medical Diagnosis  Back pain    Referring Provider  Dr. Antoine Martin    Onset Date/Surgical Date  11/15/17    Next MD Visit  12/30/17    Prior Therapy  no      Precautions   Precautions  None      Restrictions   Weight Bearing Restrictions  No      Balance Screen   Has the patient fallen in the past 6 months  No    Has the patient had a decrease in activity level because of a fear of falling?   No    Is the patient reluctant to leave their home because of a fear of falling?   No      Home Public house manager residence      Prior Function   Level of Independence  Independent    Vocation  Full time employment    Vocation Requirements  pool and spa services    Leisure  running      Cognition   Overall  Cognitive Status  Within Functional Limits for tasks assessed      Observation/Other Assessments   Focus on Therapeutic Outcomes (FOTO)          Sensation   Light Touch  Appears Intact      Coordination   Gross Motor Movements are Fluid and Coordinated  Yes      Posture/Postural Control   Posture/Postural Control  No significant limitations      ROM / Strength   AROM / PROM / Strength  AROM;Strength      AROM   AROM Assessment Site  Lumbar    Lumbar Flexion  anterior knee joint - increased pain in R LE    Lumbar Extension  WNL - no pain    Lumbar - Right Side Bend  WNL    Lumbar - Left Side Bend  WNL    Lumbar - Right Rotation  WNL    Lumbar - Left Rotation  WNL      Strength   Overall Strength Comments  R LE grossly 4-/5 to 4/5; L LE grossly 4+/5 to 5/5      Flexibility   Soft Tissue Assessment /Muscle Length  yes    Hamstrings  B moderate tightness (R>L)      Palpation   Palpation comment  TTP throughout R HS, R gastroc and R posterior knee joint      Special Tests    Special Tests  Lumbar    Lumbar Tests   Straight Leg Raise      Straight Leg Raise   Findings  Positive    Side   Right             Objective measurements completed on examination: See above findings.      [facility] Adult PT Treatment/Exercise - 12/22/17 1621      Exercises   Exercises  Lumbar      Lumbar Exercises: Stretches   Passive Hamstring Stretch  Right    Passive Hamstring Stretch Limitations  hip stabilization + lower leg extensions x 12    Prone on Elbows Stretch  3 reps;20 seconds    Figure 4 Stretch  1 rep;Supine;With overpressure    Figure 4 Stretch Limitations  no contralateral hip flexion due to intolerance      Lumbar Exercises: Seated   Other Seated Lumbar Exercises  R LE nerve glide x 8             PT Education - 12/22/17 1759    Education provided  Yes    Education Details  exam findings, POC, HEP    Person(s) Educated  Patient    Methods  Explanation;Demonstration;Handout    Comprehension  Verbalized understanding;Returned demonstration          PT Long Term Goals - 12/22/17 1802      PT LONG TERM GOAL #1   Title  patient to be independent with advanced HEP    Status  New    Target Date  02/02/18      PT LONG TERM GOAL #2   Title  patient to demonstrate full lumbar flexion AROM without pain limiting    Status  New    Target Date  02/02/18      PT LONG TERM GOAL #3   Title  patient to demonstrate R LE strength to >/= 4+/5    Status  New    Target Date  02/02/18      PT LONG TERM GOAL #4  Title  patient to report pain reduction by >/= 50%     Status  New    Target Date  02/02/18      PT LONG TERM GOAL #5   Title  patient to report ability to return to light jogging/running and normal exercise regimen without pain limiting    Status  New    Target Date  02/02/18             Plan - 12/22/17 1759    Clinical Impression Statement  Lee Martin is a 27 y/o male presenting to OPPT today regarding R lower back pain + R lower leg pain. Patient with no known  mechanism of injury other than long run coupled with standing for prolonged time. Patient today with limited lumbar flexion AROM as well as R LE flexibility and strength. Signs and symptoms consistent with MD findings of disc/nerve involvement with positive SLR. Patient today given gentle stretching + nerve glides with good carryover. Discussed possible trial of DN at next visit with patient open to this. Will benefit from PT to address pain and functional mobility limitations to allow for full return to exercise and normal mobility.     Clinical Presentation  Stable    Clinical Decision Making  Low    Rehab Potential  Good    PT Frequency  2x / week    PT Duration  6 weeks    PT Treatment/Interventions  ADLs/Self Care Home Management;Cryotherapy;Electrical Stimulation;Iontophoresis 4mg /ml Dexamethasone;Moist Heat;Traction;Therapeutic exercise;Therapeutic activities;Functional mobility training;Ultrasound;Neuromuscular re-education;Patient/family education;Manual techniques;Vasopneumatic Device;Taping;Dry needling;Passive range of motion    Consulted and Agree with Plan of Care  Patient       Patient will benefit from skilled therapeutic intervention in order to improve the following deficits and impairments:  Pain, Decreased strength, Decreased mobility, Decreased range of motion, Decreased activity tolerance  Visit Diagnosis: Acute right-sided low back pain with right-sided sciatica  Other symptoms and signs involving the musculoskeletal system     Problem List Patient Active Problem List   Diagnosis Date Noted  . Lumbar radiculopathy 12/16/2017  . Asthma, mild intermittent 05/01/2013  . INGROWN TOENAIL 06/12/2010     Kipp Laurence, PT, DPT 12/22/17 6:04 PM   [facility] [address]  [address] [place], [place], [address] Phone: [phone]   Fax:  [phone]  Name: Lee Martin MRN: [id number] Date of Birth:  1991-09-18

## 2017-12-26 ENCOUNTER — Ambulatory Visit: Payer: BLUE CROSS/BLUE SHIELD | Admitting: Physical Therapy

## 2017-12-29 ENCOUNTER — Ambulatory Visit: Payer: BLUE CROSS/BLUE SHIELD | Admitting: Physical Therapy

## 2017-12-29 ENCOUNTER — Encounter: Payer: Self-pay | Admitting: Physical Therapy

## 2017-12-29 DIAGNOSIS — M5441 Lumbago with sciatica, right side: Secondary | ICD-10-CM | POA: Diagnosis not present

## 2017-12-29 DIAGNOSIS — R29898 Other symptoms and signs involving the musculoskeletal system: Secondary | ICD-10-CM

## 2017-12-29 NOTE — Progress Notes (Signed)
[facility] [address], [place] [address] Phone: [phone] Subjective:     CC: Back pain follow-up  PPJ:[id number]  Lee Martin is a 27 y.o. male coming in with complaint of back pain follow-up.  Patient was found to have a disc protrusion causing compression of the right S1 nerve root.  Patient would like to try conservative therapy.  Saw me 2 weeks ago.  Patient was to start exercises, gabapentin, meloxicam as well as formal physical therapy which he has been to twice.  Patient states that he started to do some physical therapy. He states that he is more flexible but that his pain is still there. Patient has met with another doctor [facility]) about getting an epidural injection.       Patient does have an MRI from January 27, 2019disc protrusion displacing the right S1 and S2 nerve roots.  Past Medical History:  Diagnosis Date  . Asthma    Past Surgical History:  Procedure Laterality Date  . WISDOM TOOTH EXTRACTION     Social History   Socioeconomic History  . Marital status: Married    Spouse name: None  . Number of children: None  . Years of education: None  . Highest education level: None  Social Needs  . Financial resource strain: None  . Food insecurity - worry: None  . Food insecurity - inability: None  . Transportation needs - medical: None  . Transportation needs - non-medical: None  Occupational History  . None  Tobacco Use  . Smoking status: Never Smoker  . Smokeless tobacco: Never Used  Substance and Sexual Activity  . Alcohol use: None  . Drug use: None  . Sexual activity: None  Other Topics Concern  . None  Social History Narrative  . None   No Known Allergies Family History  Problem Relation Age of Onset  . Diabetes Father   . Hypertension Father   . Hyperlipidemia Father   . Diabetes Maternal Grandfather      Past medical history, social, surgical and family history all reviewed in electronic  medical record.  No pertanent information unless stated regarding to the chief complaint.   Review of Systems:Review of systems updated and as accurate as of 12/30/17  No headache, visual changes, nausea, vomiting, diarrhea, constipation, dizziness, abdominal pain, skin rash, fevers, chills, night sweats, weight loss, swollen lymph nodes, body aches, joint swelling, muscle aches, chest pain, shortness of breath, mood changes.   Objective  Blood pressure 120/78, pulse 78, height 5' 10"  (1.778 m), weight 206 lb (93.4 kg), SpO2 96 %. Systems examined below as of 12/30/17   General: No apparent distress alert and oriented x3 mood and affect normal, dressed appropriately.  HEENT: Pupils equal, extraocular movements intact  Respiratory: Patient's speak in full sentences and does not appear short of breath  Cardiovascular: No lower extremity edema, non tender, no erythema  Skin: Warm dry intact with no signs of infection or rash on extremities or on axial skeleton.  Abdomen: Soft nontender  Neuro: Cranial nerves II through XII are intact, neurovascularly intact in all extremities with 2+ DTRs and 2+ pulses.  Lymph: No lymphadenopathy of posterior or anterior cervical chain or axillae bilaterally.  Gait normal with good balance and coordination.  MSK:  Non tender with full range of motion and good stability and symmetric strength and tone of shoulders, elbows, wrist, hip, knee and ankles bilaterally.  Back Exam:  Inspection: Unremarkable  Motion: Flexion  35 deg worsening symptoms of the right leg, Extension 45 deg, Side Bending to 45 deg bilaterally,  Rotation to 45 deg bilaterally  SLR laying: Positive straight leg on the right XSLR laying: Negative  Palpable tenderness: Tenderness over the right sacroiliac joint. FABER: negative. Sensory change: Gross sensation intact to all lumbar and sacral dermatomes.  Reflexes: 2+ at both patellar tendons, 2+ at achilles tendons, Babinski's downgoing.    Strength at foot  Plantar-flexion: 5/5 Dorsi-flexion: 5/5 Eversion: 5/5 Inversion: 5/5  Leg strength  Quad: 5/5 Hamstring: 5/5 Hip flexor: 5/5 Hip abductors: 5/5  Gait unremarkable.    Impression and Recommendations:     This case required medical decision making of moderate complexity.      Note: This dictation was prepared with Dragon dictation along with smaller phrase technology. Any transcriptional errors that result from this process are unintentional.

## 2017-12-29 NOTE — Therapy (Signed)
[facility] Outpatient Rehabilitation [facility] [address]  [address] [place], [place], [address] Phone: [phone]   Fax:  [phone]  Physical Therapy Treatment  Patient Details  Name: Lee Martin MRN: [id number] Date of Birth: 12/02/1990 Referring Provider: Dr. Antoine Primas   Encounter Date: 12/29/2017  PT End of Session - 12/29/17 0834    Visit Number  2    Number of Visits  12    Date for PT Re-Evaluation  02/02/18    PT Start Time  0831    PT Stop Time  0920    PT Time Calculation (min)  49 min    Activity Tolerance  Patient tolerated treatment well    Behavior During Therapy  [facility] for tasks assessed/performed       Past Medical History:  Diagnosis Date  . Asthma     Past Surgical History:  Procedure Laterality Date  . WISDOM TOOTH EXTRACTION      There were no vitals filed for this visit.  Subjective Assessment - 12/29/17 0832    Subjective  saw neurosurgeon this week - confident that he will need surgery; also considering epidural injection    Diagnostic tests  MRI: Large right subarticular L5-S1 disc extrusion with superiormigration effacing the right lateral recess and posteriorly displacing the descending right S1 and S2 nerve roots. A second component of the disc herniation extends into the left subarticular zone and contacts the descending left S1 nerve root in the lateral recess.    Patient Stated Goals  return to running and exercise    Currently in Pain?  Yes    Pain Score  3     Pain Location  Buttocks    Pain Orientation  Right    Pain Descriptors / Indicators  Tightness;Shooting;Aching    Pain Type  Acute pain                      [facility] Adult PT Treatment/Exercise - 12/29/17 0835      Self-Care   Self-Care  Other Self-Care Comments    Other Self-Care Comments   self massage with foam roller to glutes, piriformis, HS; ball massage to glutes/piriformis      Lumbar Exercises: Stretches   Single Knee to  Chest Stretch  Right;1 rep;60 seconds      Lumbar Exercises: Aerobic   Recumbent Bike  L2 x 7 min      Lumbar Exercises: Standing   Functional Squats  15 reps TRX    Forward Lunge  15 reps bilateral; TRX    Other Standing Lumbar Exercises  hip hinge - 10 x 5 sec hold      Manual Therapy   Manual Therapy  Joint mobilization;Soft tissue mobilization    Manual therapy comments  patient prone    Joint Mobilization  grade III CPAs to lumbar spine - 3 x 15 sec each segment    Soft tissue mobilization  STM to R buttocks (piriformis and glutes) R sided lumbar paraspinals       Trigger Point Dry Needling - 12/29/17 0937    Consent Given?  Yes    Education Handout Provided  Yes    Muscles Treated Lower Body  Piriformis R side only    Piriformis Response  Twitch response elicited;Palpable increased muscle length                PT Long Term Goals - 12/29/17 6962      PT  LONG TERM GOAL #1   Title  patient to be independent with advanced HEP    Status  On-going      PT LONG TERM GOAL #2   Title  patient to demonstrate full lumbar flexion AROM without pain limiting    Status  On-going      PT LONG TERM GOAL #3   Title  patient to demonstrate R LE strength to >/= 4+/5    Status  On-going      PT LONG TERM GOAL #4   Title  patient to report pain reduction by >/= 50%     Status  On-going      PT LONG TERM GOAL #5   Title  patient to report ability to return to light jogging/running and normal exercise regimen without pain limiting    Status  On-going            Plan - 12/29/17 0834    Clinical Impression Statement  Lee Martin doing well today - reports seen by neuro MD with patient and MD discussing possible surgery vs epidural injection. Patient with limited symptom change since initial visit, but with good compliance of home stretching activities. Trial DN today with good twitch response noted in piriformis region. Will plan for possible DN to R sided lower lumbar  multifidi at next visit. Good tolerance to all stretching, manual and functional movements otherwise. Will continue to progress towards goals.     PT Treatment/Interventions  ADLs/Self Care Home Management;Cryotherapy;Electrical Stimulation;Iontophoresis 4mg /ml Dexamethasone;Moist Heat;Traction;Therapeutic exercise;Therapeutic activities;Functional mobility training;Ultrasound;Neuromuscular re-education;Patient/family education;Manual techniques;Vasopneumatic Device;Taping;Dry needling;Passive range of motion    Consulted and Agree with Plan of Care  Patient       Patient will benefit from skilled therapeutic intervention in order to improve the following deficits and impairments:  Pain, Decreased strength, Decreased mobility, Decreased range of motion, Decreased activity tolerance  Visit Diagnosis: Acute right-sided low back pain with right-sided sciatica  Other symptoms and signs involving the musculoskeletal system     Problem List Patient Active Problem List   Diagnosis Date Noted  . Lumbar radiculopathy 12/16/2017  . Asthma, mild intermittent 05/01/2013  . INGROWN TOENAIL 06/12/2010    Kipp LaurenceStephanie R Erskine Steinfeldt, PT, DPT 12/29/17 9:41 AM   [facility] [address]  [address] [place], [place], [address] Phone: [phone]   Fax:  [phone]  Name: Lee Martin MRN: [id number] Date of Birth: 10/25/1991

## 2017-12-29 NOTE — Patient Instructions (Signed)

## 2017-12-30 ENCOUNTER — Encounter: Payer: Self-pay | Admitting: Family Medicine

## 2017-12-30 ENCOUNTER — Ambulatory Visit: Payer: BLUE CROSS/BLUE SHIELD | Admitting: Family Medicine

## 2017-12-30 DIAGNOSIS — M5416 Radiculopathy, lumbar region: Secondary | ICD-10-CM | POA: Diagnosis not present

## 2017-12-30 NOTE — Patient Instructions (Signed)
Good to see you  I think Dumonski you are in good hands  Ask them about a nerve root injection vs an epidural and see what they think Lets continue the PT and the vitamins I think you will do well If you want to try something else like manipulation or injection then consider coming back to discuss with me  Quick questions please try my chart  See me when you need me

## 2017-12-30 NOTE — Assessment & Plan Note (Signed)
Establish billing.  Patient does have a lumbar radiculopathy.  Encourage patient to continue the gabapentin.  Patient thinks it is making a very slow improvement potentially.  Patient is looking for other potential treatments we did discuss nerve root injections versus the possibility of epidurals.  Has seen another provider and is going to consider following up with them to discuss these injections as well.  Discussed with patient that these injections could also be beneficial diagnostically and also help us with the prognosis if surgical intervention will be needed as well as liklihood of improvement.  Do not feel at this time that osteopathic manipulation would be beneficial and may need to consider at a later date if patient is continuing to make some mild improvement.  Patient is in agreement with plan and will follow up with me again 6 weeks.

## 2018-01-03 ENCOUNTER — Ambulatory Visit: Payer: BLUE CROSS/BLUE SHIELD | Admitting: Physical Therapy

## 2018-01-05 ENCOUNTER — Encounter: Payer: Self-pay | Admitting: Physical Therapy

## 2018-01-05 ENCOUNTER — Ambulatory Visit: Payer: BLUE CROSS/BLUE SHIELD | Admitting: Physical Therapy

## 2018-01-05 DIAGNOSIS — R29898 Other symptoms and signs involving the musculoskeletal system: Secondary | ICD-10-CM

## 2018-01-05 DIAGNOSIS — M5441 Lumbago with sciatica, right side: Secondary | ICD-10-CM | POA: Diagnosis not present

## 2018-01-05 NOTE — Therapy (Signed)
[facility] Outpatient Rehabilitation [facility] [address]  [address] [place], [place], [address] Phone: [phone]   Fax:  [phone]  Physical Therapy Treatment  Patient Details  Name: Lee Martin MRN: [id number] Date of Birth: Apr 05, 1991 Referring Provider: Dr. Antoine Martin   Encounter Date: 01/05/2018  PT End of Session - 01/05/18 1615    Visit Number  3    Number of Visits  12    Date for PT Re-Evaluation  02/02/18    PT Start Time  1616    PT Stop Time  1658    PT Time Calculation (min)  42 min    Activity Tolerance  Patient tolerated treatment well    Behavior During Therapy  [facility] for tasks assessed/performed       Past Medical History:  Diagnosis Date  . Asthma     Past Surgical History:  Procedure Laterality Date  . WISDOM TOOTH EXTRACTION      There were no vitals filed for this visit.  Subjective Assessment - 01/05/18 1618    Subjective  calf pain today; some progress with stretching    Diagnostic tests  MRI: Large right subarticular L5-S1 disc extrusion with superiormigration effacing the right lateral recess and posteriorly displacing the descending right S1 and S2 nerve roots. A second component of the disc herniation extends into the left subarticular zone and contacts the descending left S1 nerve root in the lateral recess.    Patient Stated Goals  return to running and exercise    Currently in Pain?  Yes    Pain Score  4     Pain Location  Leg    Pain Orientation  Right    Pain Descriptors / Indicators  Tightness;Sore    Pain Type  Acute pain                      [facility] Adult PT Treatment/Exercise - 01/05/18 1621      Lumbar Exercises: Aerobic   Recumbent Bike  L3 x 6 min      Lumbar Exercises: Seated   Other Seated Lumbar Exercises  review of sciatic nerve glide for R LE      Manual Therapy   Manual Therapy  Soft tissue mobilization;Myofascial release;Taping    Manual therapy comments  patient prone     Soft tissue mobilization  STM to R buttock, R HS, R calf, R posterior knee joint    Myofascial Release  manual trigger point release to R gastroc and R HS group    Kinesiotex  Inhibit Muscle      Kinesiotix   Inhibit Muscle   2 "I" strips for lumbar + horizontal strip over lower lumbar; popliteus taping       Trigger Point Dry Needling - 01/05/18 1721    Consent Given?  Yes    Muscles Treated Lower Body  Gastrocnemius L4, L5 multifidi    Gastrocnemius Response  Twitch response elicited;Palpable increased muscle length                PT Long Term Goals - 12/29/17 0834      PT LONG TERM GOAL #1   Title  patient to be independent with advanced HEP    Status  On-going      PT LONG TERM GOAL #2   Title  patient to demonstrate full lumbar flexion AROM without pain limiting    Status  On-going      PT  LONG TERM GOAL #3   Title  patient to demonstrate R LE strength to >/= 4+/5    Status  On-going      PT LONG TERM GOAL #4   Title  patient to report pain reduction by >/= 50%     Status  On-going      PT LONG TERM GOAL #5   Title  patient to report ability to return to light jogging/running and normal exercise regimen without pain limiting    Status  On-going            Plan - 01/05/18 1615    Clinical Impression Statement  Lee Martin doing well today - reports reduction in back and buttock pain, but is having some more tenderness in popliteal area and calf of R LE. Reports he has spoken with MD and plans to proceed with nerve root injection for pain control. DN today to lower R lumbar multifidi + R gastroc with good twitch noted. Taping also applied to low back and popliteus for hopeful pain control.     PT Treatment/Interventions  ADLs/Self Care Home Management;Cryotherapy;Electrical Stimulation;Iontophoresis 4mg /ml Dexamethasone;Moist Heat;Traction;Therapeutic exercise;Therapeutic activities;Functional mobility training;Ultrasound;Neuromuscular  re-education;Patient/family education;Manual techniques;Vasopneumatic Device;Taping;Dry needling;Passive range of motion    Consulted and Agree with Plan of Care  Patient       Patient will benefit from skilled therapeutic intervention in order to improve the following deficits and impairments:  Pain, Decreased strength, Decreased mobility, Decreased range of motion, Decreased activity tolerance  Visit Diagnosis: Acute right-sided low back pain with right-sided sciatica  Other symptoms and signs involving the musculoskeletal system     Problem List Patient Active Problem List   Diagnosis Date Noted  . Lumbar radiculopathy 12/16/2017  . Asthma, mild intermittent 05/01/2013  . INGROWN TOENAIL 06/12/2010     Kipp LaurenceStephanie R Aaron, PT, DPT 01/05/18 5:24 PM   [facility] [address]  [address] [place], [place], [address] Phone: [phone]   Fax:  [phone]  Name: [name] MRN: [id number] Date of Birth: 06/15/1991

## 2018-01-11 ENCOUNTER — Ambulatory Visit: Payer: BLUE CROSS/BLUE SHIELD | Admitting: Physical Therapy

## 2018-01-11 ENCOUNTER — Encounter: Payer: Self-pay | Admitting: Physical Therapy

## 2018-01-11 DIAGNOSIS — R29898 Other symptoms and signs involving the musculoskeletal system: Secondary | ICD-10-CM

## 2018-01-11 DIAGNOSIS — M5441 Lumbago with sciatica, right side: Secondary | ICD-10-CM | POA: Diagnosis not present

## 2018-01-11 NOTE — Therapy (Signed)
[facility] [address]  [address] [place], [place], [address] Phone: [phone]   Fax:  [phone]  Physical Therapy Treatment  Patient Details  Name: Lee Martin MRN: [id number] Date of Birth: [birth year] Referring Provider: Dr. Antoine Primas   Encounter Date: 01/11/2018  PT End of Session - 01/11/18 0800    Visit Number  4    Number of Visits  12    Date for PT Re-Evaluation  02/02/18    PT Start Time  0800    PT Stop Time  0845    PT Time Calculation (min)  45 min    Activity Tolerance  Patient tolerated treatment well    Behavior During Therapy  [facility] for tasks assessed/performed       Past Medical History:  Diagnosis Date  . Asthma     Past Surgical History:  Procedure Laterality Date  . WISDOM TOOTH EXTRACTION      There were no vitals filed for this visit.  Subjective Assessment - 01/11/18 0801    Subjective  Pt feeling like the pain is not changing much. Did get a foam roller, but feels like there is a spot in his buttocks that is very tender and not reponsive to the foam rolling. Notes shortterm relief from DN up to 2 days, but then pain comes back although does not feel quite as tight.    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 A second component of the disc herniation extends into the left subarticular zone and contacts the descending left S1 nerve root in the lateral recess.    Patient Stated Goals  return to running and exercise    Currently in Pain?  Yes    Pain Score  -- 3-4/10    Pain Location  Leg    Pain Orientation  Right;Posterior    Pain Descriptors / Indicators  Tightness;Sore    Pain Type  Acute pain    Pain Frequency  Intermittent                      [facility] Adult PT Treatment/Exercise - 01/11/18 0800      Exercises    Exercises  Lumbar      Lumbar Exercises: Stretches   Passive Hamstring Stretch  Right;30 seconds;1 rep    Passive Hamstring Stretch Limitations  supine with strap + DF for gastroc stretch    Piriformis Stretch  Right;30 seconds;2 reps    Piriformis Stretch Limitations  KTOS    Gastroc Stretch  Right;30 seconds;3 reps    Gastroc Stretch Limitations  gastroc & soleus stretches at wall; negative heel off stool    Other Lumbar Stretch Exercise  foam rolling to R glutes/piriformis x1'      Lumbar Exercises: Aerobic   Recumbent Bike  L3 x 6 min      Lumbar Exercises: Supine   Pelvic Tilt  10 reps;10 seconds    Clam  10 reps;3 seconds    Clam Limitations  abd bracing + alt hip ABD/ER with red TB    Bridge with clamshell  10 reps;5 seconds    Bridge with [employer] Limitations  isometric hip ABD with red TB    Isometric Hip Flexion  10 reps;5 seconds      Lumbar Exercises: Sidelying   Clam  Right;10 reps;3  seconds    Clam Limitations  red TB      Lumbar Exercises: Prone   Single Arm Raise  Right;Left;10 reps;3 seconds    Straight Leg Raise  10 reps;3 seconds             PT Education - 01/11/18 0845    Education provided  Yes    Education Details  HEP update    Person(s) Educated  Patient    Methods  Explanation;Demonstration;Handout    Comprehension  Verbalized understanding;Returned demonstration          PT Long Term Goals - 12/29/17 0834      PT LONG TERM GOAL #1   Title  patient to be independent with advanced HEP    Status  On-going      PT LONG TERM GOAL #2   Title  patient to demonstrate full lumbar flexion AROM without pain limiting    Status  On-going      PT LONG TERM GOAL #3   Title  patient to demonstrate R LE strength to >/= 4+/5    Status  On-going      PT LONG TERM GOAL #4   Title  patient to report pain reduction by >/= 50%     Status  On-going      PT LONG TERM GOAL #5   Title  patient to report ability to return to light  jogging/running and normal exercise regimen without pain limiting    Status  On-going            Plan - 01/11/18 0804    Clinical Impression Statement  Lee NeedleMichael noting temporary relief from prior episodes of DN and manual therapy but reports pain typically returns after ~2 days. Does note some benefit from stretching and foam rolling but still has persistant tight/painful areas in R buttock, distal HS and gastroc/soleus therefore reviewed foam rolling and stretching technqiues for these areas offering alterantive versions where pt had limited tolerance for stretches. Remaining time focusing on lumbopelvic strengthening & stabilization with good tolerance.    PT Treatment/Interventions  ADLs/Self Care Home Management;Cryotherapy;Electrical Stimulation;Iontophoresis 4mg /ml Dexamethasone;Moist Heat;Traction;Therapeutic exercise;Therapeutic activities;Functional mobility training;Ultrasound;Neuromuscular re-education;Patient/family education;Manual techniques;Vasopneumatic Device;Taping;Dry needling;Passive range of motion    Consulted and Agree with Plan of Care  Patient       Patient will benefit from skilled therapeutic intervention in order to improve the following deficits and impairments:  Pain, Decreased strength, Decreased mobility, Decreased range of motion, Decreased activity tolerance  Visit Diagnosis: Acute right-sided low back pain with right-sided sciatica  Other symptoms and signs involving the musculoskeletal system     Problem List Patient Active Problem List   Diagnosis Date Noted  . Lumbar radiculopathy 12/16/2017  . Asthma, mild intermittent 05/01/2013  . INGROWN TOENAIL 06/12/2010    Marry GuanJoAnne M Oluwatomisin Hustead, PT, MPT 01/11/2018, 8:58 AM  [facility] [address]  [address] [place], [place], [address] Phone: [phone]   Fax:  [phone]  Name: Lee Martin MRN: [id number] Date of Birth: 11/02/1991

## 2018-01-13 ENCOUNTER — Ambulatory Visit: Payer: BLUE CROSS/BLUE SHIELD | Attending: Family Medicine | Admitting: Physical Therapy

## 2018-01-13 ENCOUNTER — Encounter: Payer: Self-pay | Admitting: Physical Therapy

## 2018-01-13 DIAGNOSIS — M5441 Lumbago with sciatica, right side: Secondary | ICD-10-CM | POA: Diagnosis present

## 2018-01-13 DIAGNOSIS — R29898 Other symptoms and signs involving the musculoskeletal system: Secondary | ICD-10-CM | POA: Diagnosis present

## 2018-01-13 NOTE — Therapy (Signed)
[facility] [address]  [address] [place], [place], [address] Phone: [phone]   Fax:  [phone]  Physical Therapy Treatment  Patient Details  Name: Lee Martin MRN: [id number] Date of Birth: 07/30/1991 Referring Provider: Dr. Antoine PrimasZachary Martin   Encounter Date: 01/13/2018  PT End of Session - 01/13/18 0800    Visit Number  5    Number of Visits  12    Date for PT Re-Evaluation  02/02/18    PT Start Time  0800    PT Stop Time  0903    PT Time Calculation (min)  63 min    Activity Tolerance  Patient tolerated treatment well    Behavior During Therapy  [facility] for tasks assessed/performed       Past Medical History:  Diagnosis Date  . Asthma     Past Surgical History:  Procedure Laterality Date  . WISDOM TOOTH EXTRACTION      There were no vitals filed for this visit.  Subjective Assessment - 01/13/18 0801    Subjective  Pt feeling like the pain is a little more than usual today, but did walk around more and was moving things around yesterday.    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 A second component of the disc herniation extends into the left subarticular zone and contacts the descending left S1 nerve root in the lateral recess.    Patient Stated Goals  return to running and exercise    Currently in Pain?  Yes    Pain Score  -- 4-5/10    Pain Location  Leg    Pain Orientation  Right;Posterior    Pain Descriptors / Indicators  Tightness;Sore                      [facility] Adult PT Treatment/Exercise - 01/13/18 0800      Exercises   Exercises  Lumbar      Lumbar Exercises: Stretches   Other Lumbar Stretch Exercise  foam rolling to R glutes/piriformis, hamstrings and calf x1' each      Lumbar Exercises: Aerobic   Recumbent Bike  L3 x 6 min       Modalities   Modalities  Electrical Stimulation;Moist Heat      Moist Heat Therapy   Number Minutes Moist Heat  15 Minutes    Moist Heat Location  Hip;Knee R glutes & HS      Electrical Stimulation   Electrical Stimulation Location  R glutes & HS    Electrical Stimulation Action  IFC    Electrical Stimulation Parameters  to tolerance x15'    Electrical Stimulation Goals  Pain;Tone      Manual Therapy   Manual Therapy  Soft tissue mobilization;Myofascial release    Manual therapy comments  patient prone    Soft tissue mobilization  STM to R buttock, R HS, R calf, R posterior knee joint    Myofascial Release  manual trigger point release to R gastroc/soleus and R HS group       Trigger Point Dry Needling - 01/13/18 0800    Consent Given?  Yes    Muscles Treated Lower Body  Gluteus minimus;Gluteus maximus;Piriformis;Hamstring;Soleus    Gluteus Maximus Response  Twitch response elicited;Palpable increased muscle length    Gluteus Minimus Response  Twitch response elicited;Palpable increased  muscle length    Piriformis Response  Twitch response elicited;Palpable increased muscle length    Hamstring Response  Twitch response elicited;Palpable increased muscle length R distal medial HS    Soleus Response  Twitch response elicited;Palpable increased muscle length                PT Long Term Goals - 12/29/17 0834      PT LONG TERM GOAL #1   Title  patient to be independent with advanced HEP    Status  On-going      PT LONG TERM GOAL #2   Title  patient to demonstrate full lumbar flexion AROM without pain limiting    Status  On-going      PT LONG TERM GOAL #3   Title  patient to demonstrate R LE strength to >/= 4+/5    Status  On-going      PT LONG TERM GOAL #4   Title  patient to report pain reduction by >/= 50%     Status  On-going      PT LONG TERM GOAL #5   Title  patient to report ability to return to light jogging/running and normal exercise regimen  without pain limiting    Status  On-going            Plan - 01/13/18 0803    Clinical Impression Statement  Lee Martin reporting mildly increased pain after busier day yesterday. Feels comfortable with updated HEP. Lumbar muscle tension improving but continued tightness present t/o R posterior LE chain, therefore targeted manual therapy and DN to R glutes/piriformis, R HS and gastroc/soleus with strong twitch response elicited in medial HS which reproduced the pain he has in the back of the leg. Treatment concluded with stretching/foam rolling followed by estim and moist heat to promote further muscle relaxation. Pt noting improved ability to reach socks/shoes w/o pain following treatment which had been previously limited for him.    PT Treatment/Interventions  ADLs/Self Care Home Management;Cryotherapy;Electrical Stimulation;Iontophoresis 4mg /ml Dexamethasone;Moist Heat;Traction;Therapeutic exercise;Therapeutic activities;Functional mobility training;Ultrasound;Neuromuscular re-education;Patient/family education;Manual techniques;Vasopneumatic Device;Taping;Dry needling;Passive range of motion    Consulted and Agree with Plan of Care  Patient       Patient will benefit from skilled therapeutic intervention in order to improve the following deficits and impairments:  Pain, Decreased strength, Decreased mobility, Decreased range of motion, Decreased activity tolerance  Visit Diagnosis: Acute right-sided low back pain with right-sided sciatica  Other symptoms and signs involving the musculoskeletal system     Problem List Patient Active Problem List   Diagnosis Date Noted  . Lumbar radiculopathy 12/16/2017  . Asthma, mild intermittent 05/01/2013  . INGROWN TOENAIL 06/12/2010    Lee Martin, PT, MPT 01/13/2018, 9:24 AM  [facility] [address]  [address] [place], [place], [address] Phone: [phone]   Fax:   [phone]  Name: Lee Martin MRN: [id number] Date of Birth: 08-27-1991

## 2018-01-18 ENCOUNTER — Ambulatory Visit: Payer: BLUE CROSS/BLUE SHIELD | Admitting: Physical Therapy

## 2018-01-18 ENCOUNTER — Encounter: Payer: Self-pay | Admitting: Physical Therapy

## 2018-01-18 DIAGNOSIS — R29898 Other symptoms and signs involving the musculoskeletal system: Secondary | ICD-10-CM

## 2018-01-18 DIAGNOSIS — M5441 Lumbago with sciatica, right side: Secondary | ICD-10-CM | POA: Diagnosis not present

## 2018-01-18 NOTE — Therapy (Signed)
[facility] Outpatient Rehabilitation [facility] [address]  [address] [place], [place], [address] Phone: [phone]   Fax:  [phone]  Physical Therapy Treatment  Patient Details  Name: Lee Martin MRN: [id number] Date of Birth: 1991/10/11 Referring Provider: Dr. Antoine Primas   Encounter Date: 01/18/2018  PT End of Session - 01/18/18 0800    Visit Number  6    Number of Visits  12    Date for PT Re-Evaluation  02/02/18    PT Start Time  0800    PT Stop Time  0844    PT Time Calculation (min)  44 min    Activity Tolerance  Patient tolerated treatment well    Behavior During Therapy  [facility] for tasks assessed/performed       Past Medical History:  Diagnosis Date  . Asthma     Past Surgical History:  Procedure Laterality Date  . WISDOM TOOTH EXTRACTION      There were no vitals filed for this visit.  Subjective Assessment - 01/18/18 0801    Subjective  Pt reporting the pain in the back of his knee was essentially gone for a few days following the DN last session. Also noting improved flexibility and ease of stretching. States he will meet with his surgeon to discuss options for possible ESI.    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 A second component of the disc herniation extends into the left subarticular zone and contacts the descending left S1 nerve root in the lateral recess.    Patient Stated Goals  return to running and exercise    Currently in Pain?  Yes    Pain Score  4  3-4/10    Pain Location  Leg    Pain Orientation  Right;Posterior    Pain Descriptors / Indicators  Tightness;Sore    Pain Type  Acute pain                      [facility] Adult PT Treatment/Exercise - 01/18/18 0758      Exercises   Exercises  Lumbar      Lumbar Exercises: Stretches   ITB Stretch   Right;30 seconds;5 reps    ITB Stretch Limitations  supine with strap x2, standing at wall x3 - varying leg position to itarget TFL & HS    Gastroc Stretch  Right;30 seconds;2 reps    Gastroc Stretch Limitations  standing at wall    Other Lumbar Stretch Exercise  foam rolling to R ITB x2'      Lumbar Exercises: Aerobic   Recumbent Bike  L4 x 6 min      Lumbar Exercises: Standing   Forward Lunge  15 reps    Forward Lunge Limitations  bilatera    Other Standing Lumbar Exercises  hip hinge - 10 x 5 sec hold (pt noting able to flex further)    Other Standing Lumbar Exercises  B sidestepping & monster walk with green TB 2 x 24ft      Manual Therapy   Manual Therapy  Taping    Kinesiotex  Inhibit Muscle      Kinesiotix   Inhibit Muscle   R med/lat HS, R gastroc/soleus                  PT Long Term Goals - 12/29/17  [id number]      PT LONG TERM GOAL #1   Title  patient to be independent with advanced HEP    Status  On-going      PT LONG TERM GOAL #2   Title  patient to demonstrate full lumbar flexion AROM without pain limiting    Status  On-going      PT LONG TERM GOAL #3   Title  patient to demonstrate R LE strength to >/= 4+/5    Status  On-going      PT LONG TERM GOAL #4   Title  patient to report pain reduction by >/= 50%     Status  On-going      PT LONG TERM GOAL #5   Title  patient to report ability to return to light jogging/running and normal exercise regimen without pain limiting    Status  On-going            Plan - 01/18/18 0800    Clinical Impression Statement  Pt noting nearly complete relief of pain in back of knee for a few days following DN last visit, but pain and tightness now gradually returning. Significant tightness noted in R ITB, therefore instructed pt in stretching and foam rolling to address this. Given known tightness in HS & gastroc and expected influence of HS tightness with tight ITB, initiated trial of kinesiotaping to HS & gastroc.     PT Treatment/Interventions  ADLs/Self Care Home Management;Cryotherapy;Electrical Stimulation;Iontophoresis 4mg /ml Dexamethasone;Moist Heat;Traction;Therapeutic exercise;Therapeutic activities;Functional mobility training;Ultrasound;Neuromuscular re-education;Patient/family education;Manual techniques;Vasopneumatic Device;Taping;Dry needling;Passive range of motion    Consulted and Agree with Plan of Care  Patient       Patient will benefit from skilled therapeutic intervention in order to improve the following deficits and impairments:  Pain, Decreased strength, Decreased mobility, Decreased range of motion, Decreased activity tolerance  Visit Diagnosis: Acute right-sided low back pain with right-sided sciatica  Other symptoms and signs involving the musculoskeletal system     Problem List Patient Active Problem List   Diagnosis Date Noted  . Lumbar radiculopathy 12/16/2017  . Asthma, mild intermittent 05/01/2013  . INGROWN TOENAIL 06/12/2010    Marry GuanJoAnne M Kreis, PT, MPT 01/18/2018, 9:28 AM  [facility] [address]  [address] [place], [place], [address] Phone: [phone]   Fax:  [phone]  Name: Lee Martin MRN: [id number] Date of Birth: 12/13/1990

## 2018-01-25 ENCOUNTER — Ambulatory Visit: Payer: BLUE CROSS/BLUE SHIELD | Admitting: Physical Therapy

## 2018-01-25 ENCOUNTER — Encounter: Payer: Self-pay | Admitting: Physical Therapy

## 2018-01-25 DIAGNOSIS — M5441 Lumbago with sciatica, right side: Secondary | ICD-10-CM | POA: Diagnosis not present

## 2018-01-25 DIAGNOSIS — R29898 Other symptoms and signs involving the musculoskeletal system: Secondary | ICD-10-CM

## 2018-01-25 NOTE — Therapy (Signed)
[facility] [address]  [place], [place], [address] Phone: [phone]   Fax:  [phone]  Physical Therapy Treatment  Patient Details  Name: Lee Martin MRN: [id number] Date of Birth: Jan 02, 1991 Referring Provider: Dr. Hulan Saas   Encounter Date: 01/25/2018  PT End of Session - 01/25/18 0803    Visit Number  7    Number of Visits  12    Date for PT Re-Evaluation  02/02/18    PT Start Time  0803    PT Stop Time  0900    PT Time Calculation (min)  57 min    Activity Tolerance  Patient tolerated treatment well    Behavior During Therapy  [facility] for tasks assessed/performed       Past Medical History:  Diagnosis Date  . Asthma     Past Surgical History:  Procedure Laterality Date  . WISDOM TOOTH EXTRACTION      There were no vitals filed for this visit.  Subjective Assessment - 01/25/18 0803    Subjective  went swimming yesterday - 30 min - some more soreness today; feeling about the same overall; met with MD - injection scheduled for 02/14/18    Diagnostic tests  MRI: Large right subarticular L5-S1 disc extrusion with superiormigration effacing the right lateral recess and posteriorly displacing the descending right S1 and S2 nerve roots. A second component of the disc herniation extends into the left subarticular zone and contacts the descending left S1 nerve root in the lateral recess.    Patient Stated Goals  return to running and exercise    Currently in Pain?  Yes    Pain Score  4     Pain Location  Leg    Pain Orientation  Right;Posterior    Pain Descriptors / Indicators  Aching;Sore;Tightness    Pain Type  Acute pain                      [facility] Adult PT Treatment/Exercise - 01/25/18 0809      Lumbar Exercises: Stretches   Passive Hamstring Stretch  Right;2 reps;30 seconds      Lumbar Exercises: Aerobic   Elliptical  L3 x 6 min      Lumbar Exercises: Machines for  Strengthening   Cybex Knee Flexion  25# - B con/R ecc x 15      Lumbar Exercises: Supine   Other Supine Lumbar Exercises  HS bridge + HS curl x 15      Modalities   Modalities  Electrical Stimulation;Moist Heat      Moist Heat Therapy   Number Minutes Moist Heat  15 Minutes    Moist Heat Location  Hip;Knee R glute to HS      Electrical Stimulation   Electrical Stimulation Location  R glutes & HS    Electrical Stimulation Action  IFC    Electrical Stimulation Parameters  to tolerance x 15'    Electrical Stimulation Goals  Pain;Tone      Manual Therapy   Manual Therapy  Soft tissue mobilization;Myofascial release    Manual therapy comments  patient prone    Soft tissue mobilization  STM to R HS, R gastroc/soleus    Myofascial Release  manual trigger point release to R HS group       Trigger Point Dry Needling - 01/25/18 0835    Consent Given?  Yes    Muscles Treated Lower Body  Hamstring  Hamstring Response  Twitch response elicited;Palpable increased muscle length R distal medial HS                PT Long Term Goals - 12/29/17 0834      PT LONG TERM GOAL #1   Title  patient to be independent with advanced HEP    Status  On-going      PT LONG TERM GOAL #2   Title  patient to demonstrate full lumbar flexion AROM without pain limiting    Status  On-going      PT LONG TERM GOAL #3   Title  patient to demonstrate R LE strength to >/= 4+/5    Status  On-going      PT LONG TERM GOAL #4   Title  patient to report pain reduction by >/= 50%     Status  On-going      PT LONG TERM GOAL #5   Title  patient to report ability to return to light jogging/running and normal exercise regimen without pain limiting    Status  On-going            Plan - 01/25/18 0803    Clinical Impression Statement  Patient seen by MD - reports ha has injection scheduled for 4/2. Does report return of symptoms and requesting DN today as he feels this has given him the greatest  benefit. Patient tolerable to all HS strengthening/activation following DN without increased pain.     PT Treatment/Interventions  ADLs/Self Care Home Management;Cryotherapy;Electrical Stimulation;Iontophoresis 42m/ml Dexamethasone;Moist Heat;Traction;Therapeutic exercise;Therapeutic activities;Functional mobility training;Ultrasound;Neuromuscular re-education;Patient/family education;Manual techniques;Vasopneumatic Device;Taping;Dry needling;Passive range of motion    Consulted and Agree with Plan of Care  Patient       Patient will benefit from skilled therapeutic intervention in order to improve the following deficits and impairments:  Pain, Decreased strength, Decreased mobility, Decreased range of motion, Decreased activity tolerance  Visit Diagnosis: Acute right-sided low back pain with right-sided sciatica  Other symptoms and signs involving the musculoskeletal system     Problem List Patient Active Problem List   Diagnosis Date Noted  . Lumbar radiculopathy 12/16/2017  . Asthma, mild intermittent 05/01/2013  . INGROWN TOENAIL 06/12/2010     SLanney Gins PT, DPT 01/25/18 9:53 AM   [facility] [place] [address]Phone: [phone]  Fax:  [phone] Name: Lee WHIRLEYMRN: [id number] of Birth: 5December 05, 1992

## 2018-01-27 ENCOUNTER — Ambulatory Visit: Payer: BLUE CROSS/BLUE SHIELD | Admitting: Physical Therapy

## 2018-01-31 ENCOUNTER — Ambulatory Visit: Payer: BLUE CROSS/BLUE SHIELD | Admitting: Physical Therapy

## 2018-01-31 ENCOUNTER — Encounter: Payer: Self-pay | Admitting: Physical Therapy

## 2018-01-31 DIAGNOSIS — R29898 Other symptoms and signs involving the musculoskeletal system: Secondary | ICD-10-CM

## 2018-01-31 DIAGNOSIS — M5441 Lumbago with sciatica, right side: Secondary | ICD-10-CM | POA: Diagnosis not present

## 2018-01-31 NOTE — Therapy (Signed)
[facility] [address]  [address] [place], [place], [address] Phone: [phone]   Fax:  [phone]  Physical Therapy Treatment  Patient Details  Name: RAEF SPRIGG MRN: [id number] Date of Birth: May 08, 1991 Referring Provider: Dr. Antoine Primas   Encounter Date: 01/31/2018  PT End of Session - 01/31/18 0807    Visit Number  8    Number of Visits  12    Date for PT Re-Evaluation  02/02/18    PT Start Time  0805    PT Stop Time  0846    PT Time Calculation (min)  41 min    Activity Tolerance  Patient tolerated treatment well    Behavior During Therapy  [facility] for tasks assessed/performed       Past Medical History:  Diagnosis Date  . Asthma     Past Surgical History:  Procedure Laterality Date  . WISDOM TOOTH EXTRACTION      There were no vitals filed for this visit.  Subjective Assessment - 01/31/18 0806    Subjective  having some increased stiffness; got a back brace - started wearing it yesterday    Diagnostic tests  MRI: Large right subarticular L5-S1 disc extrusion with superiormigration effacing the right lateral recess and posteriorly displacing the descending right S1 and S2 nerve roots. A second component of the disc herniation extends into the left subarticular zone and contacts the descending left S1 nerve root in the lateral recess.    Patient Stated Goals  return to running and exercise    Currently in Pain?  Yes    Pain Score  4     Pain Location  Back and leg    Pain Orientation  Right;Posterior    Pain Descriptors / Indicators  Aching;Sore;Tightness    Pain Type  Acute pain                      [facility] Adult PT Treatment/Exercise - 01/31/18 0808      Lumbar Exercises: Stretches   Gastroc Stretch  Right;3 reps;30 seconds    Gastroc Stretch Limitations  prostretch      Lumbar Exercises: Aerobic   Elliptical  L4 x 6 min      Lumbar Exercises: Machines for Strengthening   Leg  Press  B calf raise - 25# x 15 reps      Lumbar Exercises: Standing   Heel Raises  15 reps B con/R ecc      Manual Therapy   Manual Therapy  Soft tissue mobilization;Myofascial release    Manual therapy comments  patient prone    Soft tissue mobilization  STM to R gastroc/soleus, R HS    Myofascial Release  manual trigger point release to R gastroc       Trigger Point Dry Needling - 01/31/18 0837    Consent Given?  Yes    Muscles Treated Lower Body  Gastrocnemius    Gastrocnemius Response  Twitch response elicited;Palpable increased muscle length                PT Long Term Goals - 12/29/17 0834      PT LONG TERM GOAL #1   Title  patient to be independent with advanced HEP    Status  On-going      PT LONG TERM GOAL #2   Title  patient to demonstrate full lumbar flexion AROM without pain limiting    Status  On-going  PT LONG TERM GOAL #3   Title  patient to demonstrate R LE strength to >/= 4+/5    Status  On-going      PT LONG TERM GOAL #4   Title  patient to report pain reduction by >/= 50%     Status  On-going      PT LONG TERM GOAL #5   Title  patient to report ability to return to light jogging/running and normal exercise regimen without pain limiting    Status  On-going            Plan - 01/31/18 0807    Clinical Impression Statement  Primary complaints of R calf tightness today - good response to DN at this area today. Tolerable to stretching and activation of this area following DN. Likely to wrap up current POC at next session, prior to injection.     PT Treatment/Interventions  ADLs/Self Care Home Management;Cryotherapy;Electrical Stimulation;Iontophoresis 4mg /ml Dexamethasone;Moist Heat;Traction;Therapeutic exercise;Therapeutic activities;Functional mobility training;Ultrasound;Neuromuscular re-education;Patient/family education;Manual techniques;Vasopneumatic Device;Taping;Dry needling;Passive range of motion    Consulted and Agree with Plan  of Care  Patient       Patient will benefit from skilled therapeutic intervention in order to improve the following deficits and impairments:  Pain, Decreased strength, Decreased mobility, Decreased range of motion, Decreased activity tolerance  Visit Diagnosis: Acute right-sided low back pain with right-sided sciatica  Other symptoms and signs involving the musculoskeletal system     Problem List Patient Active Problem List   Diagnosis Date Noted  . Lumbar radiculopathy 12/16/2017  . Asthma, mild intermittent 05/01/2013  . INGROWN TOENAIL 06/12/2010     Kipp LaurenceStephanie R Aaron, PT, DPT 01/31/18 8:53 AM   [facility] [place] [address]  [address] [place], [place], [address] Phone: [phone]   Fax:  [phone]  Name: Azalee CourseMichael D Viereck MRN: [id number] Date of Birth: 04/26/1991

## 2018-02-02 ENCOUNTER — Ambulatory Visit: Payer: BLUE CROSS/BLUE SHIELD | Admitting: Physical Therapy

## 2018-02-02 ENCOUNTER — Encounter: Payer: Self-pay | Admitting: Physical Therapy

## 2018-02-02 DIAGNOSIS — M5441 Lumbago with sciatica, right side: Secondary | ICD-10-CM

## 2018-02-02 DIAGNOSIS — R29898 Other symptoms and signs involving the musculoskeletal system: Secondary | ICD-10-CM

## 2018-02-02 NOTE — Therapy (Addendum)
[facility] [place] [facility] [address]  [place], [place], [address] Phone: [phone]   Fax:  [phone]  Physical Therapy Treatment  Patient Details  Name: Lee Martin MRN: [id number] Date of Birth: December 20, 1990 Referring Provider: Dr. Hulan Saas   Encounter Date: 02/02/2018  PT End of Session - 02/02/18 1719    Visit Number  9    Number of Visits  12    Date for PT Re-Evaluation  02/02/18    PT Start Time  1717 pt late    PT Stop Time  1745    PT Time Calculation (min)  28 min    Activity Tolerance  Patient tolerated treatment well    Behavior During Therapy  [facility] for tasks assessed/performed       Past Medical History:  Diagnosis Date  . Asthma     Past Surgical History:  Procedure Laterality Date  . WISDOM TOOTH EXTRACTION      There were no vitals filed for this visit.  Subjective Assessment - 02/02/18 1717    Subjective  doing well - been busy at work; sore after DN; calf feels a lot looser    Diagnostic tests  MRI: Large right subarticular L5-S1 disc extrusion with superiormigration effacing the right lateral recess and posteriorly displacing the descending right S1 and S2 nerve roots. A second component of the disc herniation extends into the left subarticular zone and contacts the descending left S1 nerve root in the lateral recess.    Patient Stated Goals  return to running and exercise    Currently in Pain?  Yes    Pain Score  4     Pain Location  Back and leg    Pain Orientation  Right;Posterior                      [facility] Adult PT Treatment/Exercise - 02/02/18 0001      Lumbar Exercises: Stretches   Gastroc Stretch  Right;3 reps;30 seconds    Gastroc Stretch Limitations  prostretch      Lumbar Exercises: Aerobic   Elliptical  L5 x 5 min      Lumbar Exercises: Standing   Functional Squats  15 reps unsupported    Forward Lunge Limitations  alternating walking lunges x 40 feet    Other Standing Lumbar Exercises  R hip drop x 15    Other Standing Lumbar Exercises  B side stepping - green tband x 25 feet each direction; fwd/bwd monster walks - green tband 2 x 25 feet each      Lumbar Exercises: Supine   Isometric Hip Flexion  15 reps;5 seconds      Lumbar Exercises: Sidelying   Hip Abduction  Right;15 reps some back pain      Lumbar Exercises: Prone   Other Prone Lumbar Exercises  R hip extension - target LE to floor x 15 reps    Other Prone Lumbar Exercises  R hip - fire hydrants x 15 reps                  PT Long Term Goals - 02/02/18 1719      PT LONG TERM GOAL #1   Title  patient to be independent with advanced HEP    Status  Achieved      PT LONG TERM GOAL #2   Title  patient to demonstrate full lumbar flexion AROM without pain limiting    Status  Achieved      PT LONG TERM GOAL #3   Title  patient to demonstrate R LE strength to >/= 4+/5    Status  Achieved      PT LONG TERM GOAL #4   Title  patient to report pain reduction by >/= 50%     Status  Achieved      PT LONG TERM GOAL #5   Title  patient to report ability to return to light jogging/running and normal exercise regimen without pain limiting    Status  Not Met            Plan - 02/02/18 1720    Clinical Impression Statement  Patient has made excellent progress with PT - improved lumbar AROM, strength and general mobility with less pain/discomfort noted. Patient tolerable to all strength training today wtih good understanding to continue to perform for maximum benefit. Patient meeting or partially meeting all goals now - planned injection on 02/14/18 - will assess need for PT at that point. Otherwise on 30 day hold.     PT Treatment/Interventions  ADLs/Self Care Home Management;Cryotherapy;Electrical Stimulation;Iontophoresis 47m/ml Dexamethasone;Moist Heat;Traction;Therapeutic exercise;Therapeutic activities;Functional mobility training;Ultrasound;Neuromuscular  re-education;Patient/family education;Manual techniques;Vasopneumatic Device;Taping;Dry needling;Passive range of motion    Consulted and Agree with Plan of Care  Patient       Patient will benefit from skilled therapeutic intervention in order to improve the following deficits and impairments:  Pain, Decreased strength, Decreased mobility, Decreased range of motion, Decreased activity tolerance  Visit Diagnosis: Acute right-sided low back pain with right-sided sciatica  Other symptoms and signs involving the musculoskeletal system     Problem List Patient Active Problem List   Diagnosis Date Noted  . Lumbar radiculopathy 12/16/2017  . Asthma, mild intermittent 05/01/2013  . INGROWN TOENAIL 06/12/2010     SLanney Gins PT, DPT 02/02/18 5:49 PM  PHYSICAL THERAPY DISCHARGE SUMMARY  Visits from Start of Care: 9  Current functional level related to goals / functional outcomes: See above   Remaining deficits: See above   Education / Equipment: HEP  Plan: Patient agrees to discharge.  Patient goals were partially met. Patient is being discharged due to being pleased with the current functional level.  ?????    SLanney Gins PT, DPT 03/13/18 12:48 PM    [facility] [place] [address]Phone: [phone]  Fax:  [phone] Name: Lee CUETOMRN: [id number] of Birth: 510/03/1991

## 2019-01-07 IMAGING — MR MR LUMBAR SPINE W/O CM
4 of 5 series · 18 of 48 positions shown · non-contrast
Comparison: None.

CLINICAL DATA: Low back pain radiating to the right lower extremity

EXAM:
MRI LUMBAR SPINE WITHOUT CONTRAST
TECHNIQUE: Multiplanar, multisequence MR imaging of the lumbar spine was
performed. No intravenous contrast was administered.

[Series 6: T2 · sagittal · 4.0mm · 0.73mm/px · 6 of 15 slices shown (1 of 2)]
[im 1/15]
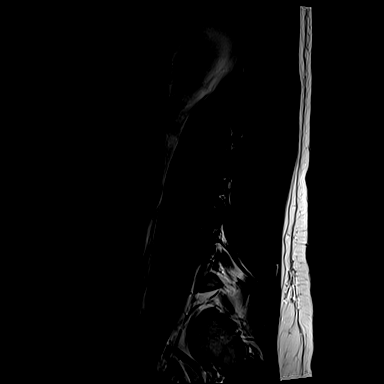
[im 3/15]
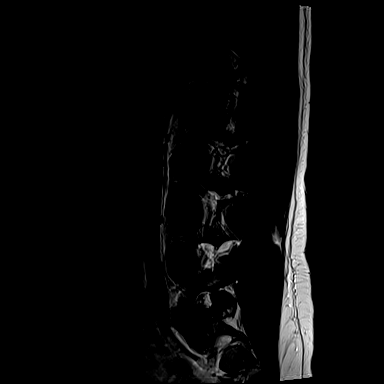
[im 6/15]
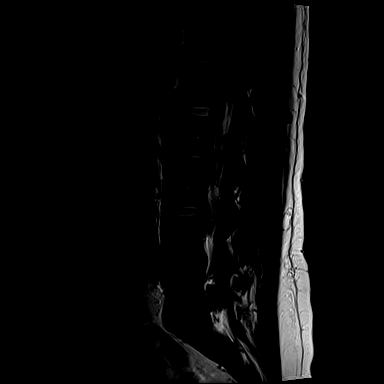
[im 9/15]
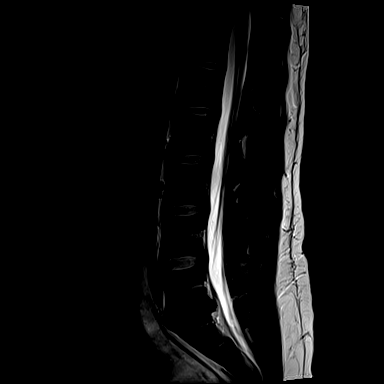
[im 12/15]
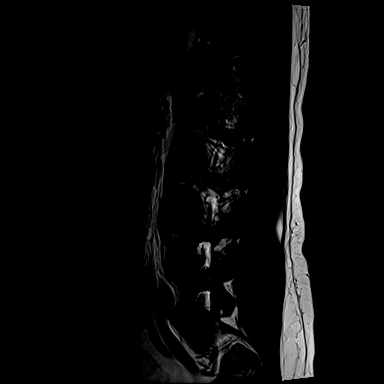
[im 15/15]
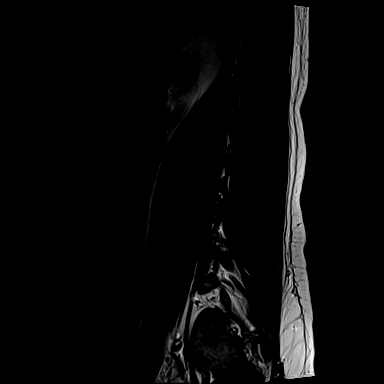

[Series 7: T1 · sagittal · 4.0mm · 0.73mm/px · 3 of 15 slices shown (1 of 2)]
[im 3/15]
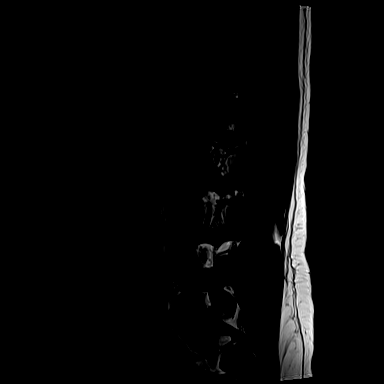
[im 9/15]
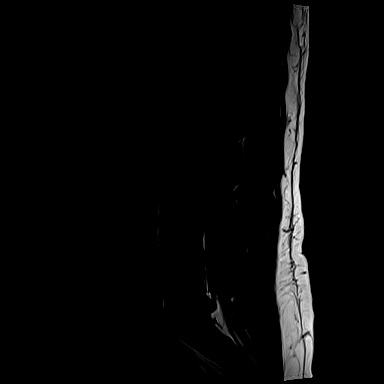
[im 15/15]
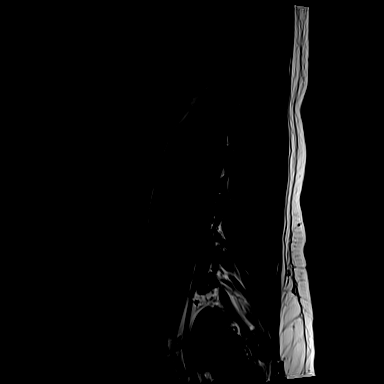

[Series 11: T1 · axial · 4.0mm · 0.28mm/px · z∈[+25,+187]mm · 3 of 41 slices shown (2 of 2)]
[im 6/41]
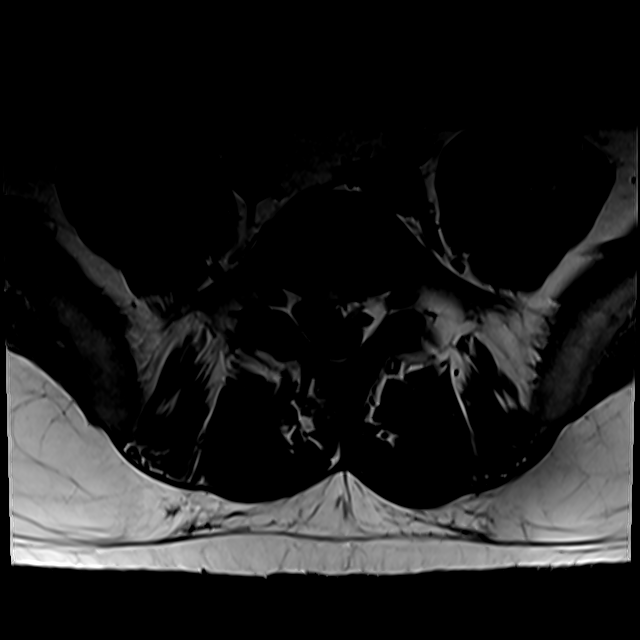
[im 21/41]
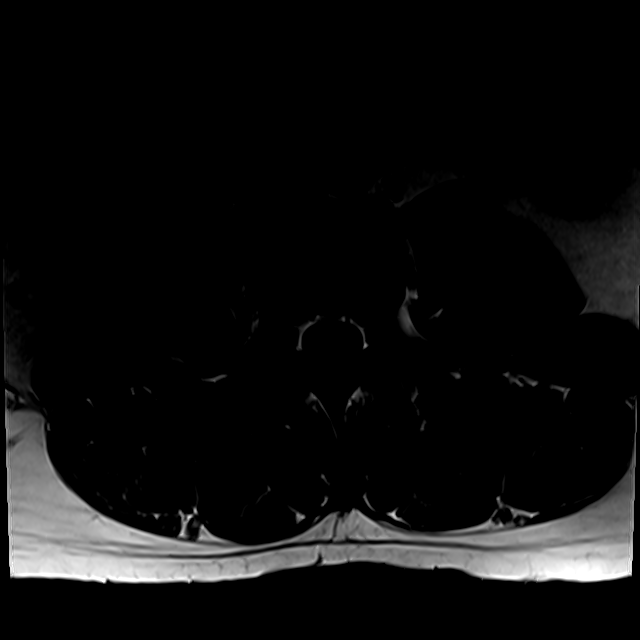
[im 35/41]
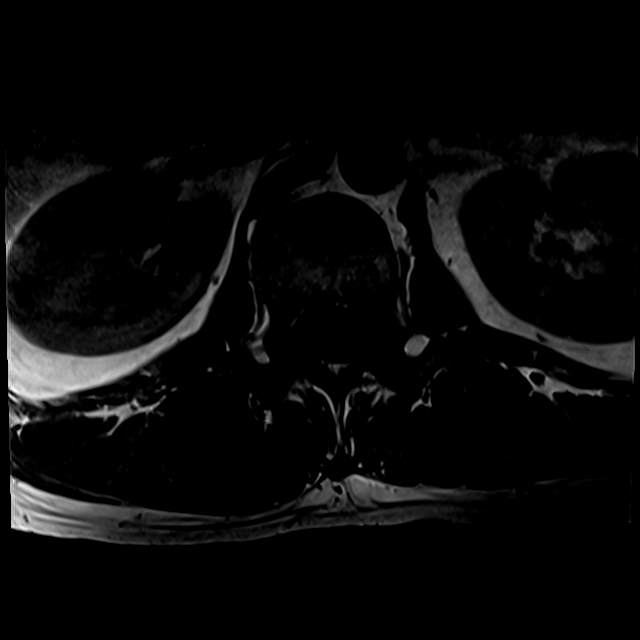

[Series 14: T2 · axial · 4.0mm · 0.28mm/px · z∈[+0,+187]mm · 6 of 41 slices shown (2 of 2)]
[im 1/41]
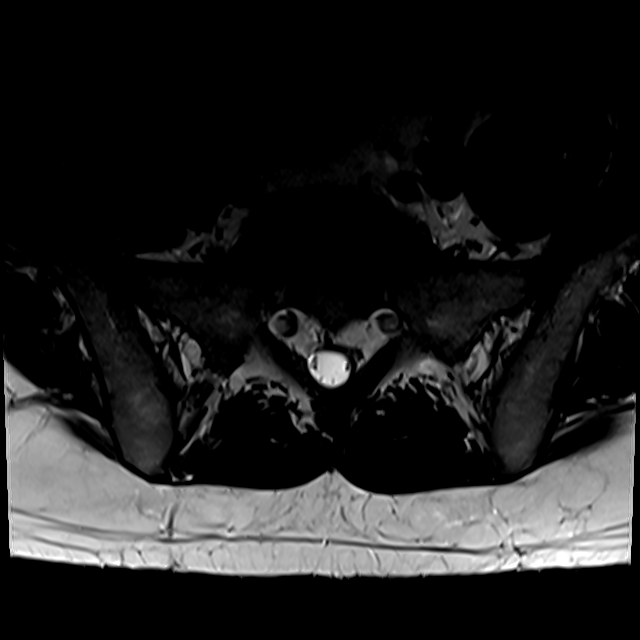
[im 6/41]
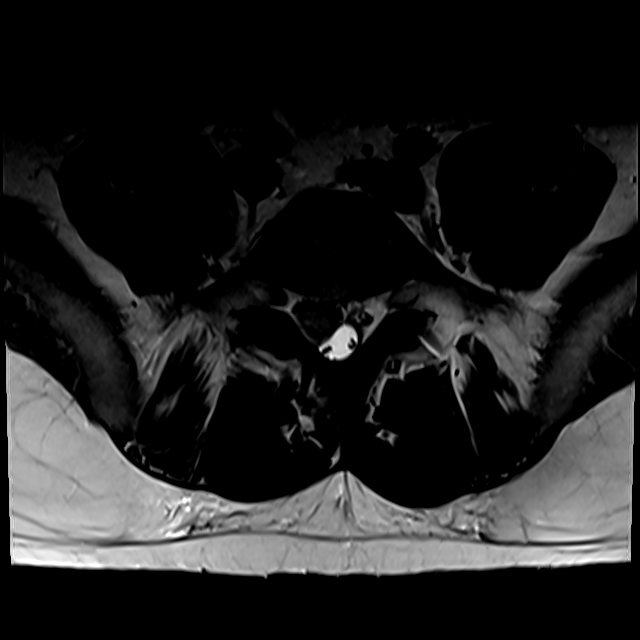
[im 12/41]
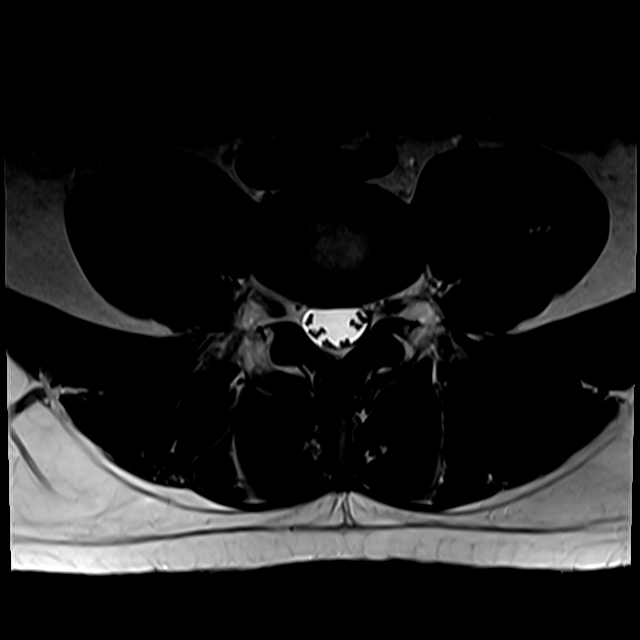
[im 18/41]
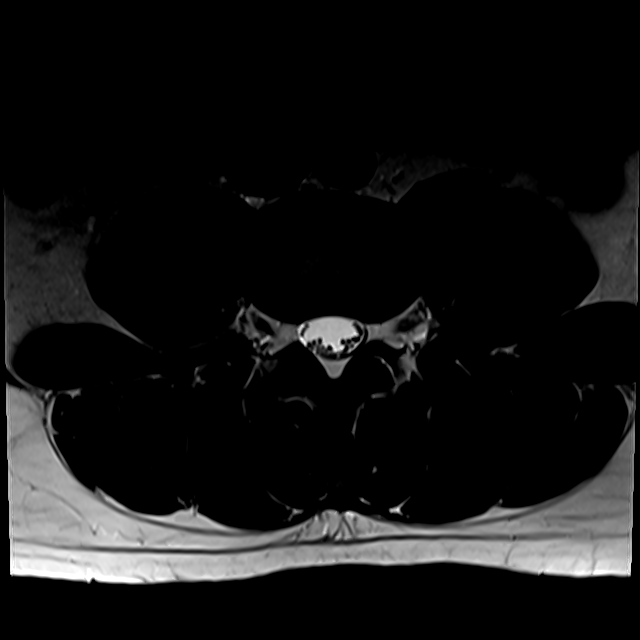
[im 21/41]
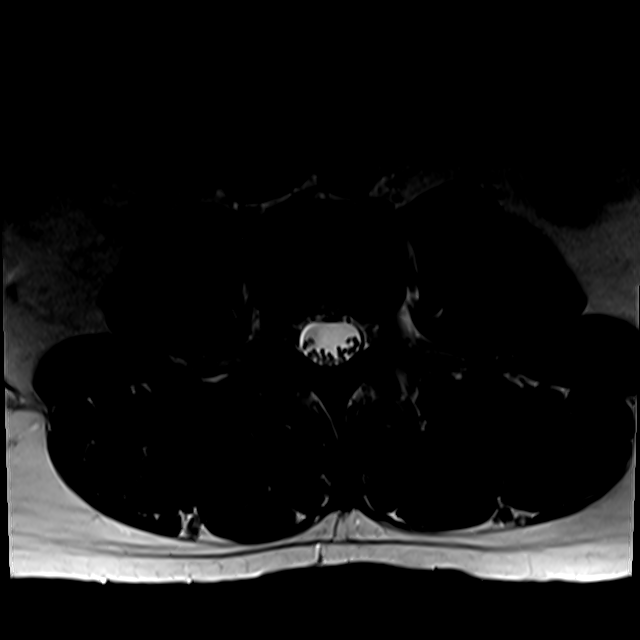
[im 35/41]
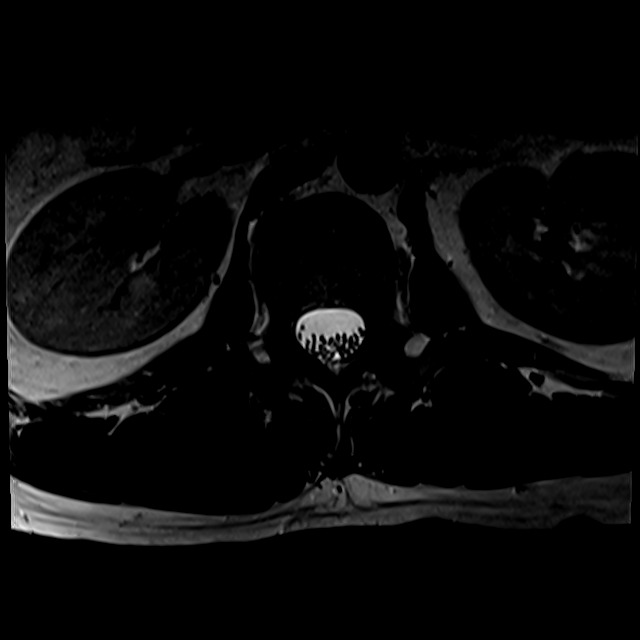

[18 of 48 positions shown; findings below may reference images not displayed]

FINDINGS: Segmentation:  Standard.

Alignment:  Physiologic.

Vertebrae:  No fracture, evidence of discitis, or bone lesion.

Conus medullaris and cauda equina: Conus extends to the L1 level.
Conus and cauda equina appear normal.

Paraspinal and other soft tissues: Negative.

Disc levels:

The visualized disc spaces above L5-S1 are normal.

L5-S1: There is a large right subarticular disc extrusion with 2 cm
of superior migration to the L5 infrapedicle level. This effaces the
right lateral recess and displaces the descending right S1 and S2
nerve roots. A component of the disc in the left subarticular zone
also minimally displaces the left S1 nerve root. There is no central
spinal canal or neural foraminal stenosis.
IMPRESSION: Large right subarticular L5-S1 disc extrusion with superior
migration effacing the right lateral recess and posteriorly
displacing the descending right S1 and S2 nerve roots. A second
component of the disc herniation extends into the left subarticular
zone and contacts the descending left S1 nerve root in the lateral
recess.

## 2019-05-15 ENCOUNTER — Telehealth: Payer: Self-pay | Admitting: Family Medicine

## 2019-05-15 NOTE — Telephone Encounter (Signed)
Medication Refill - Medication: albuterol (PROAIR HFA) 108 (90 Base) MCG/ACT inhaler    Has the patient contacted their pharmacy? Yes.   (Agent: If no, request that the patient contact the pharmacy for the refill.) (Agent: If yes, when and what did the pharmacy advise?)  Preferred Pharmacy (with phone number or street name):  [id number] - [place], [place] - [address] AT [place] [phone] (Phone) [phone] (Fax)     Agent: Please be advised that RX refills may take up to 3 business days. We ask that you follow-up with your pharmacy.

## 2019-05-16 ENCOUNTER — Ambulatory Visit (INDEPENDENT_AMBULATORY_CARE_PROVIDER_SITE_OTHER): Payer: Self-pay | Admitting: Family Medicine

## 2019-05-16 ENCOUNTER — Other Ambulatory Visit: Payer: Self-pay

## 2019-05-16 DIAGNOSIS — J4520 Mild intermittent asthma, uncomplicated: Secondary | ICD-10-CM

## 2019-05-16 MED ORDER — ALBUTEROL SULFATE HFA 108 (90 BASE) MCG/ACT IN AERS: 2.0000 | INHALATION_SPRAY | RESPIRATORY_TRACT | 2 refills | Status: DC | PRN

## 2019-05-16 NOTE — Progress Notes (Signed)
Patient ID: Lee Martin, male   DOB: 12/27/1990, 28 y.o.   MRN: [id number]  This visit type was conducted due to national recommendations for restrictions regarding the COVID-19 pandemic in an effort to limit this patient's exposure and mitigate transmission in our community.   Virtual Visit via Video Note  I connected with Lee Martin on 05/16/19 at  2:00 PM EDT by a video enabled telemedicine application and verified that I am speaking with the correct person using two identifiers.  Location patient: home Location provider:work or home office Persons participating in the virtual visit: patient, provider  I discussed the limitations of evaluation and management by telemedicine and the availability of in person appointments. The patient expressed understanding and agreed to proceed.   HPI: Patient has history of mild intermittent asthma.  He takes albuterol as needed.  He states that somewhere between March and May he has about 1 month that is particularly bothersome where he wheezes much of the month.  We had started him on Qvar in the past but he did not feel that helped much.  He states that generally 1-2 inhalers will last him for the entire year.  He does not recall trying Singulair previously.  Denies any wheezing currently.  No cough.  No fever.  Took up kickboxing earlier this year and has lost about 20 pounds and feels well overall.   ROS: See pertinent positives and negatives per HPI.  Past Medical History:  Diagnosis Date  . Asthma     Past Surgical History:  Procedure Laterality Date  . WISDOM TOOTH EXTRACTION      Family History  Problem Relation Age of Onset  . Diabetes Father   . Hypertension Father   . Hyperlipidemia Father   . Diabetes Maternal Grandfather     SOCIAL HX: Non-smoker   Current Outpatient Medications:  .  albuterol (PROAIR HFA) 108 (90 Base) MCG/ACT inhaler, Inhale 2 puffs into the lungs every 4 (four) hours as needed for wheezing., Disp:  6.7 g, Rfl: 2 .  beclomethasone (QVAR) 80 MCG/ACT inhaler, Inhale 2 puffs into the lungs 2 (two) times daily. Reported on 03/16/2016, Disp: 1 Inhaler, Rfl: 6 .  cholecalciferol (VITAMIN D) 1000 units tablet, Take 1,000 Units by mouth daily., Disp: , Rfl:  .  gabapentin (NEURONTIN) 100 MG capsule, Take 2 capsules (200 mg total) by mouth at bedtime., Disp: 60 capsule, Rfl: 3 .  meloxicam (MOBIC) 15 MG tablet, Take 1 tablet (15 mg total) by mouth daily., Disp: 30 tablet, Rfl: 0  EXAM:  VITALS per patient if applicable:  GENERAL: alert, oriented, appears well and in no acute distress  HEENT: atraumatic, conjunttiva clear, no obvious abnormalities on inspection of external nose and ears  NECK: normal movements of the head and neck  LUNGS: on inspection no signs of respiratory distress, breathing rate appears normal, no obvious gross SOB, gasping or wheezing  CV: no obvious cyanosis  MS: moves all visible extremities without noticeable abnormality  PSYCH/NEURO: pleasant and cooperative, no obvious depression or anxiety, speech and thought processing grossly intact  ASSESSMENT AND PLAN:  Discussed the following assessment and plan:  Mild intermittent asthma without complication  -Refill albuterol for as needed use -Discussed difference between intermittent persistent asthma.  If wheezing becomes more frequent consider either trial of Singulair or another steroid inhaler -We did discuss possible use of Singulair next spring prior to his major allergy season to see if this helps    I discussed the assessment  and treatment plan with the patient. The patient was provided an opportunity to ask questions and all were answered. The patient agreed with the plan and demonstrated an understanding of the instructions.   The patient was advised to call back or seek an in-person evaluation if the symptoms worsen or if the condition fails to improve as anticipated.   Carolann Littler, MD

## 2019-05-16 NOTE — Telephone Encounter (Signed)
Patient has not been seen in over a year. He has a Doxy today on his email because his cell phone will not allow speaker phone to talk and the video will not work without speaker phone.   He needs this refilled.

## 2019-10-02 ENCOUNTER — Encounter: Payer: Self-pay | Admitting: Family Medicine

## 2019-10-19 ENCOUNTER — Encounter: Payer: Self-pay | Admitting: Family Medicine

## 2020-05-01 ENCOUNTER — Other Ambulatory Visit: Payer: Self-pay | Admitting: Family Medicine

## 2020-05-02 ENCOUNTER — Other Ambulatory Visit: Payer: Self-pay | Admitting: Family Medicine

## 2020-11-24 ENCOUNTER — Other Ambulatory Visit: Payer: Self-pay

## 2020-11-24 DIAGNOSIS — Z20822 Contact with and (suspected) exposure to covid-19: Secondary | ICD-10-CM

## 2020-11-27 LAB — NOVEL CORONAVIRUS, NAA: SARS-CoV-2, NAA: NOT DETECTED

## 2020-11-27 LAB — SPECIMEN STATUS REPORT

## 2020-11-27 LAB — SARS-COV-2, NAA 2 DAY TAT

## 2021-03-03 NOTE — Progress Notes (Signed)
[facility] [address] [place] [address] Phone: [phone] Subjective:   Bruce Donath, am serving as a scribe for Dr. Antoine Primas. This visit occurred during the SARS-CoV-2 public health emergency.  Safety protocols were in place, including screening questions prior to the visit, additional usage of staff PPE, and extensive cleaning of exam room while observing appropriate contact time as indicated for disinfecting solutions.   I'm seeing this patient by the request  of:  Kristian Covey, MD  CC: Bilateral wrist pain  [id number]  AAMARI STRAWDERMAN is a 30 y.o. male coming in with complaint of B wrist pain. Seen in 2019 for back pain. Patient states that pain has been worsening over past 2 months. Pain over wrist, ulnar side. Does have newborn and carries car seat. Did have tingling last night when he woke up but otherwise does not have any during the day. Patient has had to drop weight in the gym.  Patient is specific injury well.    Past Medical History:  Diagnosis Date  . Asthma    Past Surgical History:  Procedure Laterality Date  . WISDOM TOOTH EXTRACTION     Social History   Socioeconomic History  . Marital status: Married    Spouse name: Not on file  . Number of children: Not on file  . Years of education: Not on file  . Highest education level: Not on file  Occupational History  . Not on file  Tobacco Use  . Smoking status: Never Smoker  . Smokeless tobacco: Never Used  Substance and Sexual Activity  . Alcohol use: Not on file  . Drug use: Not on file  . Sexual activity: Not on file  Other Topics Concern  . Not on file  Social History Narrative  . Not on file   Social Determinants of Health   Financial Resource Strain: Not on file  Food Insecurity: Not on file  Transportation Needs: Not on file  Physical Activity: Not on file  Stress: Not on file  Social Connections: Not on file   No Known  Allergies Family History  Problem Relation Age of Onset  . Diabetes Father   . Hypertension Father   . Hyperlipidemia Father   . Diabetes Maternal Grandfather       Current Outpatient Medications (Respiratory):  .  albuterol (VENTOLIN HFA) 108 (90 Base) MCG/ACT inhaler, Inhale 1-2 puffs into the lungs every 4 (four) hours as needed for wheezing or shortness of breath. NEEDS OFFICE VISIT FOR FURTHER REFILLS .  beclomethasone (QVAR) 80 MCG/ACT inhaler, Inhale 2 puffs into the lungs 2 (two) times daily. Reported on 03/16/2016  Current Outpatient Medications (Analgesics):  .  meloxicam (MOBIC) 15 MG tablet, Take 1 tablet (15 mg total) by mouth daily.   Current Outpatient Medications (Other):  .  cholecalciferol (VITAMIN D) 1000 units tablet, Take 1,000 Units by mouth daily. Marland Kitchen  gabapentin (NEURONTIN) 100 MG capsule, Take 2 capsules (200 mg total) by mouth at bedtime.   Reviewed prior external information including notes and imaging from  primary care provider As well as notes that were available from care everywhere and other healthcare systems.  Past medical history, social, surgical and family history all reviewed in electronic medical record.  No pertanent information unless stated regarding to the chief complaint.   Review of Systems:  No headache, visual changes, nausea, vomiting, diarrhea, constipation, dizziness, abdominal pain, skin rash, fevers, chills, night sweats, weight loss, swollen lymph  nodes, body aches, joint swelling, chest pain, shortness of breath, mood changes. POSITIVE muscle aches  Objective  Blood pressure 106/76, pulse 72, height 5\' 10"  (1.778 m), weight 219 lb (99.3 kg), SpO2 97 %.   General: No apparent distress alert and oriented x3 mood and affect normal, dressed appropriately.  HEENT: Pupils equal, extraocular movements intact  Respiratory: Patient's speak in full sentences and does not appear short of breath  Cardiovascular: No lower extremity edema,  non tender, no erythema  Gait normal with good balance and coordination.  MSK: Patient with mild tenderness over the lateral aspect of the wrist.  Patient does have mild limited dorsiflexion.  Good grip strength.  Negative Tinel's  Limited musculoskeletal was performed and interpreted by Patient states that the patient has hypoechoic changes noted right greater than left.  Patient is interested in the common extensor is normal.  Continues Recent extensor common ulnaris, no acute tear noted. Impression: = Tendinitis of the wrist bilaterally right greater than left   Impression and Recommendations:     The above documentation has been reviewed and is accurate and complete Judi Saa, DO

## 2021-03-04 ENCOUNTER — Encounter: Payer: Self-pay | Admitting: Family Medicine

## 2021-03-04 ENCOUNTER — Ambulatory Visit: Payer: Self-pay

## 2021-03-04 ENCOUNTER — Other Ambulatory Visit: Payer: Self-pay

## 2021-03-04 ENCOUNTER — Ambulatory Visit (INDEPENDENT_AMBULATORY_CARE_PROVIDER_SITE_OTHER): Payer: 59 | Admitting: Family Medicine

## 2021-03-04 VITALS — BP 106/76 | HR 72 | Ht 70.0 in | Wt 219.0 lb

## 2021-03-04 DIAGNOSIS — M778 Other enthesopathies, not elsewhere classified: Secondary | ICD-10-CM | POA: Diagnosis not present

## 2021-03-04 DIAGNOSIS — M25531 Pain in right wrist: Secondary | ICD-10-CM | POA: Diagnosis not present

## 2021-03-04 DIAGNOSIS — M25532 Pain in left wrist: Secondary | ICD-10-CM | POA: Diagnosis not present

## 2021-03-04 NOTE — Patient Instructions (Signed)
ECU taping Exercises Ice Pennsaid See me in 5-6 weeks

## 2021-03-04 NOTE — Assessment & Plan Note (Signed)
Extensor tendinitis of the wrist bilaterally.  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  Increase activity as well as bracing.  Discussed anti-inflammatory as needed.  Discussed changing in the lifting mechanics follow-up again 6 weeks worsening pain consider formal physical therapy

## 2021-04-07 ENCOUNTER — Telehealth: Payer: Self-pay | Admitting: Family Medicine

## 2021-04-07 MED ORDER — ALBUTEROL SULFATE HFA 108 (90 BASE) MCG/ACT IN AERS: 1.0000 | INHALATION_SPRAY | RESPIRATORY_TRACT | 0 refills | Status: DC | PRN

## 2021-04-07 NOTE — Telephone Encounter (Signed)
Pt is requesting a refill   albuterol (VENTOLIN HFA) 108 (90 Base) MCG/ACT inhaler  [id number] - [place], [place] - [address] AT [facility] [address]  Phone:  [phone] Fax:  [phone]

## 2021-04-07 NOTE — Telephone Encounter (Signed)
Rx sent in. Nothing further needed.  

## 2021-04-08 NOTE — Progress Notes (Signed)
[facility] [address] [place] [address] Phone: [phone] Subjective:   I Ronelle Nigh am serving as a Neurosurgeon for Dr. Antoine Primas.  This visit occurred during the SARS-CoV-2 public health emergency.  Safety protocols were in place, including screening questions prior to the visit, additional usage of staff PPE, and extensive cleaning of exam room while observing appropriate contact time as indicated for disinfecting solutions.   I'm seeing this patient by the request  of:  Kristian Covey, MD  CC: wrist pain follow up   ALP:[id number]   03/04/2021 Extensor tendinitis of the wrist bilaterally.  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  Increase activity as well as bracing.  Discussed anti-inflammatory as needed.  Discussed changing in the lifting mechanics follow-up again 6 weeks worsening pain consider formal physical therapy  Update 04/09/2021 Lee Martin is a 30 y.o. male coming in with complaint of B wrist pain. Patient states he still feel some tightness on the left. Right is not too bad. Left has always been worse.  Patient states that he is feeling approximately 50% better though.  Has not been wearing the tape much because it would not seem to last very long.  Patient is doing the exercises very intermittently.     Past Medical History:  Diagnosis Date  . Asthma    Past Surgical History:  Procedure Laterality Date  . WISDOM TOOTH EXTRACTION     Social History   Socioeconomic History  . Marital status: Married    Spouse name: Not on file  . Number of children: Not on file  . Years of education: Not on file  . Highest education level: Not on file  Occupational History  . Not on file  Tobacco Use  . Smoking status: Never Smoker  . Smokeless tobacco: Never Used  Substance and Sexual Activity  . Alcohol use: Not on file  . Drug use: Not on file  .  Sexual activity: Not on file  Other Topics Concern  . Not on file  Social History Narrative  . Not on file   Social Determinants of Health   Financial Resource Strain: Not on file  Food Insecurity: Not on file  Transportation Needs: Not on file  Physical Activity: Not on file  Stress: Not on file  Social Connections: Not on file   No Known Allergies Family History  Problem Relation Age of Onset  . Diabetes Father   . Hypertension Father   . Hyperlipidemia Father   . Diabetes Maternal Grandfather       Current Outpatient Medications (Respiratory):  .  albuterol (VENTOLIN HFA) 108 (90 Base) MCG/ACT inhaler, Inhale 1-2 puffs into the lungs every 4 (four) hours as needed for wheezing or shortness of breath. NEEDS OFFICE VISIT FOR FURTHER REFILLS .  beclomethasone (QVAR) 80 MCG/ACT inhaler, Inhale 2 puffs into the lungs 2 (two) times daily. Reported on 03/16/2016  Current Outpatient Medications (Analgesics):  .  meloxicam (MOBIC) 15 MG tablet, Take 1 tablet (15 mg total) by mouth daily.   Current Outpatient Medications (Other):  .  cholecalciferol (VITAMIN D) 1000 units tablet, Take 1,000 Units by mouth daily. Marland Kitchen  gabapentin (NEURONTIN) 100 MG capsule, Take 2 capsules (200 mg total) by mouth at bedtime.   Reviewed prior external information including notes and imaging from  primary care provider As well as notes that were available from care everywhere  and other healthcare systems.  Past medical history, social, surgical and family history all reviewed in electronic medical record.  No pertanent information unless stated regarding to the chief complaint.   Review of Systems:  No headache, visual changes, nausea, vomiting, diarrhea, constipation, dizziness, abdominal pain, skin rash, fevers, chills, night sweats, weight loss, swollen lymph nodes, body aches, joint swelling, chest pain, shortness of breath, mood changes. POSITIVE muscle aches  Objective  Blood pressure 110/82,  pulse 72, height 5\' 10"  (1.778 m), weight 212 lb (96.2 kg), SpO2 99 %.   General: No apparent distress alert and oriented x3 mood and affect normal, dressed appropriately.  HEENT: Pupils equal, extraocular movements intact  Respiratory: Patient's speak in full sentences and does not appear short of breath  Cardiovascular: No lower extremity edema, non tender, no erythema  Gait normal with good balance and coordination.  MSK: Left wrist exam still shows that some mild pain with Finkelstein test.  Mild pain over the ECU.  Does have very mild discomfort over the TFCC.  Good grip strength noted though.  Limited musculoskeletal ultrasound was performed and interpreted by  Limited ultrasound of patient's left wrist shows the patient still has some mild hypoechoic changes of the first, third and fifth dorsal compartment tendons but significantly decreased in the hypoechoic changes from previous exam. Impression: Interval improvement but continued hypoechoic changes    Impression and Recommendations:     The above documentation has been reviewed and is accurate and complete Judi Saa, DO

## 2021-04-09 ENCOUNTER — Ambulatory Visit: Payer: Self-pay

## 2021-04-09 ENCOUNTER — Encounter: Payer: Self-pay | Admitting: Family Medicine

## 2021-04-09 ENCOUNTER — Ambulatory Visit (INDEPENDENT_AMBULATORY_CARE_PROVIDER_SITE_OTHER): Payer: 59 | Admitting: Family Medicine

## 2021-04-09 ENCOUNTER — Other Ambulatory Visit: Payer: Self-pay

## 2021-04-09 VITALS — BP 110/82 | HR 72 | Ht 70.0 in | Wt 212.0 lb

## 2021-04-09 DIAGNOSIS — M25531 Pain in right wrist: Secondary | ICD-10-CM | POA: Diagnosis not present

## 2021-04-09 DIAGNOSIS — M778 Other enthesopathies, not elsewhere classified: Secondary | ICD-10-CM

## 2021-04-09 DIAGNOSIS — M25532 Pain in left wrist: Secondary | ICD-10-CM | POA: Diagnosis not present

## 2021-04-09 NOTE — Assessment & Plan Note (Signed)
Continued mild tendinitis of the wrist noted.  Patient will start wearing a brace.  Given That I think will be beneficial.  We discussed icing regimen.  Discussed with avoiding certain repetitive extension of the wrist with weight if possible.  Follow-up again with me in 6 weeks.  Continue to have pain consider formal physical therapy or injections.  Do not feel advanced imaging is warranted today.

## 2021-04-09 NOTE — Patient Instructions (Addendum)
Wrist brace with working out and maybe during repetitive movement.  Ice is your friend  Continue the exercises intermittently  See me again in 6-8 weeks to make sure you are better overall

## 2021-05-25 ENCOUNTER — Other Ambulatory Visit: Payer: Self-pay

## 2021-05-25 NOTE — Progress Notes (Signed)
[facility] [address] [place] [address] Phone: [phone] Subjective:   Bruce Donath, am serving as a scribe for Dr. Antoine Primas.  This visit occurred during the SARS-CoV-2 public health emergency.  Safety protocols were in place, including screening questions prior to the visit, additional usage of staff PPE, and extensive cleaning of exam room while observing appropriate contact time as indicated for disinfecting solutions.    I'm seeing this patient by the request  of:  Kristian Covey, MD  CC: Wrist pain follow-up  [id number]  04/09/2021 Continued mild tendinitis of the wrist noted.  Patient will start wearing a brace.  Given That I think will be beneficial.  We discussed icing regimen.  Discussed with avoiding certain repetitive extension of the wrist with weight if possible.  Follow-up again with me in 6 weeks.  Continue to have pain consider formal physical therapy or injections.  Do not feel advanced imaging is warranted today.  Update 05/28/2021 Lee Martin is a 30 y.o. male coming in with complaint of B wrist pain. Patient states that he has been doing much better. No pain with grasping or lifting heavy items. Slight pain at end ranges.  Patient is a feeling 95% better overall.  He states that he has changes with the mechanics which has been very helpful.      Past Medical History:  Diagnosis Date   Asthma    Past Surgical History:  Procedure Laterality Date   WISDOM TOOTH EXTRACTION     Social History   Socioeconomic History   Marital status: Married    Spouse name: Not on file   Number of children: Not on file   Years of education: Not on file   Highest education level: Not on file  Occupational History   Not on file  Tobacco Use   Smoking status: Never   Smokeless tobacco: Never  Substance and Sexual Activity   Alcohol use: Not on file   Drug use: Not on file   Sexual activity: Not on file  Other  Topics Concern   Not on file  Social History Narrative   Not on file   Social Determinants of Health   Financial Resource Strain: Not on file  Food Insecurity: Not on file  Transportation Needs: Not on file  Physical Activity: Not on file  Stress: Not on file  Social Connections: Not on file   No Known Allergies Family History  Problem Relation Age of Onset   Diabetes Father    Hypertension Father    Hyperlipidemia Father    Diabetes Maternal Grandfather       Current Outpatient Medications (Respiratory):    albuterol (VENTOLIN HFA) 108 (90 Base) MCG/ACT inhaler, Inhale 1-2 puffs into the lungs every 4 (four) hours as needed for wheezing or shortness of breath. NEEDS OFFICE VISIT FOR FURTHER REFILLS   beclomethasone (QVAR) 80 MCG/ACT inhaler, Inhale 2 puffs into the lungs 2 (two) times daily. Reported on 03/16/2016  Current Outpatient Medications (Analgesics):    meloxicam (MOBIC) 15 MG tablet, Take 1 tablet (15 mg total) by mouth daily.   Current Outpatient Medications (Other):    benzoyl peroxide-erythromycin (BENZAMYCIN) gel, Apply topically 2 (two) times daily.   cholecalciferol (VITAMIN D) 1000 units tablet, Take 1,000 Units by mouth daily.   gabapentin (NEURONTIN) 100 MG capsule, Take 2 capsules (200 mg total) by mouth at bedtime.   Reviewed prior external information including notes and imaging from  primary care provider As well as notes that were available from care everywhere and other healthcare systems.  Past medical history, social, surgical and family history all reviewed in electronic medical record.  No pertanent information unless stated regarding to the chief complaint.   Review of Systems:  No headache, visual changes, nausea, vomiting, diarrhea, constipation, dizziness, abdominal pain, skin rash, fevers, chills, night sweats, weight loss, swollen lymph nodes, body aches, joint swelling, chest pain, shortness of breath, mood changes. POSITIVE muscle  aches  Objective  Blood pressure 104/80, pulse 68, height 5\' 10"  (1.778 m), weight 214 lb (97.1 kg), SpO2 98 %.   General: No apparent distress alert and oriented x3 mood and affect normal, dressed appropriately.  HEENT: Pupils equal, extraocular movements intact  Respiratory: Patient's speak in full sentences and does not appear short of breath  Cardiovascular: No lower extremity edema, non tender, no erythema  Gait normal with good balance and coordination.  MSK: Bilateral wrist exam shows the patient has improvement in range of motion.  He still lacks last 5 degrees of extension on the right wrist.  Nontender on exam.  Given strength noted.  Limited musculoskeletal ultrasound was performed and interpreted by  Limited ultrasound shows significant decrease in hypoechoic changes within the tendon sheath that was noted previously.  Still some mild more than baseline on the right side.   Impression and Recommendations:     The above documentation has been reviewed and is accurate and complete Judi Saa, DO

## 2021-05-26 ENCOUNTER — Encounter: Payer: Self-pay | Admitting: Family Medicine

## 2021-05-26 ENCOUNTER — Ambulatory Visit (INDEPENDENT_AMBULATORY_CARE_PROVIDER_SITE_OTHER): Payer: 59 | Admitting: Family Medicine

## 2021-05-26 VITALS — BP 118/62 | HR 62 | Temp 98.2°F | Wt 215.1 lb

## 2021-05-26 DIAGNOSIS — L0889 Other specified local infections of the skin and subcutaneous tissue: Secondary | ICD-10-CM

## 2021-05-26 MED ORDER — BENZOYL PEROXIDE-ERYTHROMYCIN 5-3 % EX GEL: Freq: Two times a day (BID) | CUTANEOUS | 1 refills | Status: DC

## 2021-05-26 NOTE — Patient Instructions (Signed)
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  Keep feet as dry as possible  Let me know if not clearing in 2 weeks.

## 2021-05-26 NOTE — Progress Notes (Signed)
Established Patient Office Visit  Subjective:  Patient ID: Lee Martin, male    DOB: Oct 03, 1991  Age: 30 y.o. MRN: [id number]  CC:  Chief Complaint  Patient presents with   Rash    Bottom of both feet, x 1 month, no discomfort    HPI Lee Martin presents for 1 month history of rash on both feet.  Initially thought this was a callus.  He has equal involvement of both feet.  His feet do sweat a bit.  He also noticed some recent increased odor which he has not had previously.  He then started noticed some pitting in multiple areas especially involving the balls of the feet.  No pruritus.  No pain.  No other rashes noted.  No interdigital involvement.  Past Medical History:  Diagnosis Date   Asthma     Past Surgical History:  Procedure Laterality Date   WISDOM TOOTH EXTRACTION      Family History  Problem Relation Age of Onset   Diabetes Father    Hypertension Father    Hyperlipidemia Father    Diabetes Maternal Grandfather     Social History   Socioeconomic History   Marital status: Married    Spouse name: Not on file   Number of children: Not on file   Years of education: Not on file   Highest education level: Not on file  Occupational History   Not on file  Tobacco Use   Smoking status: Never   Smokeless tobacco: Never  Substance and Sexual Activity   Alcohol use: Not on file   Drug use: Not on file   Sexual activity: Not on file  Other Topics Concern   Not on file  Social History Narrative   Not on file   Social Determinants of Health   Financial Resource Strain: Not on file  Food Insecurity: Not on file  Transportation Needs: Not on file  Physical Activity: Not on file  Stress: Not on file  Social Connections: Not on file  Intimate Partner Violence: Not on file    Outpatient Medications Prior to Visit  Medication Sig Dispense Refill   albuterol (VENTOLIN HFA) 108 (90 Base) MCG/ACT inhaler Inhale 1-2 puffs into the lungs every 4 (four) hours  as needed for wheezing or shortness of breath. NEEDS OFFICE VISIT FOR FURTHER REFILLS 6.7 g 0   beclomethasone (QVAR) 80 MCG/ACT inhaler Inhale 2 puffs into the lungs 2 (two) times daily. Reported on 03/16/2016 1 Inhaler 6   cholecalciferol (VITAMIN D) 1000 units tablet Take 1,000 Units by mouth daily.     gabapentin (NEURONTIN) 100 MG capsule Take 2 capsules (200 mg total) by mouth at bedtime. 60 capsule 3   meloxicam (MOBIC) 15 MG tablet Take 1 tablet (15 mg total) by mouth daily. 30 tablet 0   No facility-administered medications prior to visit.    No Known Allergies  ROS Review of Systems  Constitutional:  Negative for chills and fever.  Skin:  Positive for rash.     Objective:    Physical Exam Vitals reviewed.  Constitutional:      Appearance: Normal appearance.  Cardiovascular:     Rate and Rhythm: Normal rate and regular rhythm.  Skin:    Findings: Rash present.     Comments: He has some patchy areas of whitish discoloration with superficial pitting especially involving the ball of the foot but extending more distally as well.  Neurological:     Mental Status: He is  alert.    BP 118/62 (BP Location: Left Arm, Patient Position: Sitting, Cuff Size: Normal)   Pulse 62   Temp 98.2 F (36.8 C) (Oral)   Wt 215 lb 1.6 oz (97.6 kg)   SpO2 99%   BMI 30.86 kg/m  Wt Readings from Last 3 Encounters:  05/26/21 215 lb 1.6 oz (97.6 kg)  04/09/21 212 lb (96.2 kg)  03/04/21 219 lb (99.3 kg)     Health Maintenance Due  Topic Date Due   COVID-19 Vaccine (1) Never done   HIV Screening  Never done   Hepatitis C Screening  Never done   TETANUS/TDAP  04/26/2021    There are no preventive care reminders to display for this patient.  Lab Results  Component Value Date   TSH 1.90 12/31/2010   Lab Results  Component Value Date   WBC 5.8 12/31/2010   HGB 15.7 12/31/2010   HCT 45.4 12/31/2010   MCV 91.8 12/31/2010   PLT 213.0 12/31/2010   Lab Results  Component Value Date    NA 142 12/31/2010   K 4.8 12/31/2010   CO2 29 12/31/2010   GLUCOSE 91 12/31/2010   BUN 19 12/31/2010   CREATININE 1.0 12/31/2010   BILITOT 0.7 12/31/2010   ALKPHOS 98 12/31/2010   AST 23 12/31/2010   ALT 22 12/31/2010   PROT 6.8 12/31/2010   ALBUMIN 4.4 12/31/2010   CALCIUM 9.5 12/31/2010   GFR 106.15 12/31/2010   Lab Results  Component Value Date   CHOL 121 12/31/2010   Lab Results  Component Value Date   HDL 54.30 12/31/2010   Lab Results  Component Value Date   LDLCALC 60 12/31/2010   Lab Results  Component Value Date   TRIG 36.0 12/31/2010   Lab Results  Component Value Date   CHOLHDL 2 12/31/2010   No results found for: HGBA1C    Assessment & Plan:   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  Be in touch if this is not clearing over the next 2 to 3 weeks.  Consider oral medication if not clearing with topical   Meds ordered this encounter  Medications   benzoyl peroxide-erythromycin (BENZAMYCIN) gel    Sig: Apply topically 2 (two) times daily.    Dispense:  23.3 g    Refill:  1    Follow-up: No follow-ups on file.    Evelena Peat, MD

## 2021-05-28 ENCOUNTER — Ambulatory Visit: Payer: Self-pay

## 2021-05-28 ENCOUNTER — Ambulatory Visit (INDEPENDENT_AMBULATORY_CARE_PROVIDER_SITE_OTHER): Payer: 59 | Admitting: Family Medicine

## 2021-05-28 ENCOUNTER — Encounter: Payer: Self-pay | Admitting: Family Medicine

## 2021-05-28 ENCOUNTER — Other Ambulatory Visit: Payer: Self-pay

## 2021-05-28 VITALS — BP 104/80 | HR 68 | Ht 70.0 in | Wt 214.0 lb

## 2021-05-28 DIAGNOSIS — M25531 Pain in right wrist: Secondary | ICD-10-CM | POA: Diagnosis not present

## 2021-05-28 DIAGNOSIS — M25532 Pain in left wrist: Secondary | ICD-10-CM | POA: Diagnosis not present

## 2021-05-28 DIAGNOSIS — M778 Other enthesopathies, not elsewhere classified: Secondary | ICD-10-CM | POA: Diagnosis not present

## 2021-05-28 NOTE — Assessment & Plan Note (Signed)
Patient seems to have no significant discomfort at this time.  Ultrasound showed very mild hypoechoic changes of the right side but otherwise seems to be doing much better.  Can follow-up with me as needed

## 2021-08-03 ENCOUNTER — Other Ambulatory Visit: Payer: Self-pay | Admitting: Family Medicine

## 2022-11-28 ENCOUNTER — Other Ambulatory Visit: Payer: Self-pay | Admitting: Family Medicine

## 2023-02-25 ENCOUNTER — Other Ambulatory Visit: Payer: Self-pay | Admitting: Family Medicine

## 2023-03-29 ENCOUNTER — Telehealth: Payer: Self-pay | Admitting: Family Medicine

## 2023-03-29 NOTE — Telephone Encounter (Signed)
Prescription Request  03/29/2023  LOV: Visit date not found  What is the name of the medication or equipment? albuterol (VENTOLIN HFA) 108 (90 Base) MCG/ACT inhaler   Have you contacted your pharmacy to request a refill? Yes   Which pharmacy would you like this sent to?   [id number], [place] - [address] [address] Phone: [phone] Fax: [phone]    Patient notified that their request is being sent to the clinical staff for review and that they should receive a response within 2 business days.   Please advise at Mobile [phone] (mobile)

## 2023-03-30 NOTE — Telephone Encounter (Signed)
Left a message for the patient to return my call.  

## 2023-03-30 NOTE — Telephone Encounter (Signed)
Unable to refill medication until patient is seen. Patient called onto the office today and was informed to schedule a visit for further refills.

## 2023-04-01 ENCOUNTER — Ambulatory Visit (INDEPENDENT_AMBULATORY_CARE_PROVIDER_SITE_OTHER): Payer: 59 | Admitting: Adult Health

## 2023-04-01 ENCOUNTER — Encounter: Payer: Self-pay | Admitting: Adult Health

## 2023-04-01 VITALS — BP 112/80 | HR 81 | Temp 98.1°F | Ht 70.0 in | Wt 220.0 lb

## 2023-04-01 DIAGNOSIS — J4520 Mild intermittent asthma, uncomplicated: Secondary | ICD-10-CM | POA: Diagnosis not present

## 2023-04-01 MED ORDER — ALBUTEROL SULFATE HFA 108 (90 BASE) MCG/ACT IN AERS: INHALATION_SPRAY | RESPIRATORY_TRACT | 0 refills | Status: DC

## 2023-04-01 NOTE — Progress Notes (Signed)
Subjective:    Patient ID: Lee Martin, male    DOB: March 14, 1991, 32 y.o.   MRN: [id number]  HPI   Patient has history of mild intermittent asthma.  He takes albuterol as needed. His worst symptoms are in the springtime where he wheezes much of the month. He has tried Qvar in the past but did not find that this helped much. The rest of the year is not bad for him.   Review of Systems  See HPI  Past Medical History:  Diagnosis Date   Asthma     Social History   Socioeconomic History   Marital status: Married    Spouse name: Not on file   Number of children: Not on file   Years of education: Not on file   Highest education level: Not on file  Occupational History   Not on file  Tobacco Use   Smoking status: Never   Smokeless tobacco: Never  Substance and Sexual Activity   Alcohol use: Not on file   Drug use: Not on file   Sexual activity: Not on file  Other Topics Concern   Not on file  Social History Narrative   Not on file   Social Determinants of Health   Financial Resource Strain: Not on file  Food Insecurity: Not on file  Transportation Needs: Not on file  Physical Activity: Not on file  Stress: Not on file  Social Connections: Not on file  Intimate Partner Violence: Not on file    Past Surgical History:  Procedure Laterality Date   WISDOM TOOTH EXTRACTION      Family History  Problem Relation Age of Onset   Diabetes Father    Hypertension Father    Hyperlipidemia Father    Diabetes Maternal Grandfather     No Known Allergies  Current Outpatient Medications on File Prior to Visit  Medication Sig Dispense Refill   albuterol (VENTOLIN HFA) 108 (90 Base) MCG/ACT inhaler INHALE 1 OR 2 PUFFS INTO THE LUNGS EVERY 4 HOURS AS NEEDED FOR WHEEZING 8.5 each 0   beclomethasone (QVAR) 80 MCG/ACT inhaler Inhale 2 puffs into the lungs 2 (two) times daily. Reported on 03/16/2016 1 Inhaler 6   benzoyl peroxide-erythromycin (BENZAMYCIN) gel Apply topically 2  (two) times daily. 23.3 g 1   cholecalciferol (VITAMIN D) 1000 units tablet Take 1,000 Units by mouth daily.     gabapentin (NEURONTIN) 100 MG capsule Take 2 capsules (200 mg total) by mouth at bedtime. 60 capsule 3   meloxicam (MOBIC) 15 MG tablet Take 1 tablet (15 mg total) by mouth daily. 30 tablet 0   No current facility-administered medications on file prior to visit.    BP 112/80   Pulse 81   Temp 98.1 F (36.7 C) (Oral)   Ht 5\' 10"  (1.778 m)   Wt 220 lb (99.8 kg)   SpO2 94%   BMI 31.57 kg/m       Objective:   Physical Exam Vitals and nursing note reviewed.  Constitutional:      Appearance: Normal appearance.  Cardiovascular:     Rate and Rhythm: Normal rate and regular rhythm.     Pulses: Normal pulses.     Heart sounds: Normal heart sounds.  Pulmonary:     Effort: Pulmonary effort is normal.     Breath sounds: Normal breath sounds.  Skin:    General: Skin is warm and dry.  Neurological:     General: No focal deficit present.  Mental Status: He is alert and oriented to person, place, and time.  Psychiatric:        Mood and Affect: Mood normal.        Behavior: Behavior normal.        Thought Content: Thought content normal.        Judgment: Judgment normal.        Assessment & Plan:  1. Mild intermittent asthma without complication  - albuterol (VENTOLIN HFA) 108 (90 Base) MCG/ACT inhaler; INHALE 1 OR 2 PUFFS INTO THE LUNGS EVERY 4 HOURS AS NEEDED FOR WHEEZING  Dispense: 8.5 each; Refill: 0  He did not want to do a CPE today. Advised follow up with his PCP for CPE since it has been multiple years. Will also need to get refills of inhaler from CPE    Shirline Frees, NP

## 2023-05-25 ENCOUNTER — Encounter: Payer: 59 | Admitting: Family Medicine

## 2024-03-31 ENCOUNTER — Other Ambulatory Visit: Payer: Self-pay | Admitting: Family Medicine

## 2024-03-31 DIAGNOSIS — J4520 Mild intermittent asthma, uncomplicated: Secondary | ICD-10-CM
